# Patient Record
Sex: Male | Born: 1975 | Race: White | Hispanic: No | Marital: Married | Smoking: Never smoker
Health system: Southern US, Community
[De-identification: ages and names within clinical notes are randomized; demographics above are authoritative.]

## PROBLEM LIST (undated history)

## (undated) DIAGNOSIS — J309 Allergic rhinitis, unspecified: Secondary | ICD-10-CM

## (undated) DIAGNOSIS — R7303 Prediabetes: Secondary | ICD-10-CM

## (undated) DIAGNOSIS — C801 Malignant (primary) neoplasm, unspecified: Secondary | ICD-10-CM

## (undated) HISTORY — DX: Malignant (primary) neoplasm, unspecified: C80.1

## (undated) HISTORY — PX: TONSILLECTOMY: SUR1361

## (undated) HISTORY — DX: Allergic rhinitis, unspecified: J30.9

## (undated) HISTORY — PX: HERNIA REPAIR: SHX51

## (undated) HISTORY — DX: Prediabetes: R73.03

---

## 2006-01-05 ENCOUNTER — Emergency Department (HOSPITAL_COMMUNITY): Admission: EM | Admit: 2006-01-05 | Discharge: 2006-01-05 | Payer: Self-pay | Admitting: Family Medicine

## 2006-05-14 ENCOUNTER — Emergency Department (HOSPITAL_COMMUNITY): Admission: EM | Admit: 2006-05-14 | Discharge: 2006-05-14 | Payer: Self-pay | Admitting: Family Medicine

## 2007-02-02 ENCOUNTER — Emergency Department (HOSPITAL_COMMUNITY): Admission: EM | Admit: 2007-02-02 | Discharge: 2007-02-02 | Payer: Self-pay | Admitting: Emergency Medicine

## 2010-12-11 ENCOUNTER — Encounter: Payer: Self-pay | Admitting: Internal Medicine

## 2010-12-11 DIAGNOSIS — Z0289 Encounter for other administrative examinations: Secondary | ICD-10-CM

## 2013-06-02 ENCOUNTER — Encounter (HOSPITAL_BASED_OUTPATIENT_CLINIC_OR_DEPARTMENT_OTHER): Payer: Self-pay | Admitting: Emergency Medicine

## 2013-06-02 ENCOUNTER — Emergency Department (HOSPITAL_BASED_OUTPATIENT_CLINIC_OR_DEPARTMENT_OTHER)
Admission: EM | Admit: 2013-06-02 | Discharge: 2013-06-02 | Disposition: A | Discharge status: Home / Self Care | Payer: 59 | Attending: Emergency Medicine | Admitting: Emergency Medicine

## 2013-06-02 DIAGNOSIS — Z23 Encounter for immunization: Secondary | ICD-10-CM | POA: Insufficient documentation

## 2013-06-02 DIAGNOSIS — W268XXA Contact with other sharp object(s), not elsewhere classified, initial encounter: Secondary | ICD-10-CM | POA: Insufficient documentation

## 2013-06-02 DIAGNOSIS — Y929 Unspecified place or not applicable: Secondary | ICD-10-CM | POA: Insufficient documentation

## 2013-06-02 DIAGNOSIS — S61412A Laceration without foreign body of left hand, initial encounter: Secondary | ICD-10-CM

## 2013-06-02 DIAGNOSIS — Y9389 Activity, other specified: Secondary | ICD-10-CM | POA: Insufficient documentation

## 2013-06-02 DIAGNOSIS — S61409A Unspecified open wound of unspecified hand, initial encounter: Secondary | ICD-10-CM | POA: Insufficient documentation

## 2013-06-02 MED ORDER — TETANUS-DIPHTH-ACELL PERTUSSIS 5-2.5-18.5 LF-MCG/0.5 IM SUSP
0.5000 mL | Freq: Once | INTRAMUSCULAR | Status: AC
  Administered 2013-06-02: 0.5 mL via INTRAMUSCULAR
  Filled 2013-06-02: qty 0.5

## 2013-06-02 MED ORDER — BACITRACIN 500 UNIT/GM EX OINT
1.0000 | TOPICAL_OINTMENT | Freq: Two times a day (BID) | CUTANEOUS | Status: DC
Start: 2013-06-02 — End: 2013-06-03
  Administered 2013-06-02: 1 via TOPICAL
  Filled 2013-06-02: qty 0.9

## 2013-06-02 NOTE — ED Provider Notes (Signed)
CSN: [id number]     Arrival date & time 06/02/13  1807 History   First MD Initiated Contact with Patient 06/02/13 2051     Chief Complaint  Patient presents with  . Hand Injury     (Consider location/radiation/quality/duration/timing/severity/associated sxs/prior Treatment) Patient is a 38 y.o. male presenting with scalp laceration. The history is provided by the patient.  Head Laceration This is a new problem. The current episode started today. The problem has been unchanged.   Jimmy Hodge is a 38 y.o. male who presents to the ED with a laceration to the left hand. He picked up a vase that was broken and it cut him. He applied pressure at home but the bleeding continued. He denies any other problems or injuries tonight.   History reviewed. No pertinent past medical history. Past Surgical History  Procedure Laterality Date  . Tonsillectomy     No family history on file. History  Substance Use Topics  . Smoking status: Never Smoker   . Smokeless tobacco: Not on file  . Alcohol Use: Yes    Review of Systems Negative except as stated in HPI   Allergies  Review of patient's allergies indicates no known allergies.  Home Medications  No current outpatient prescriptions on file. BP 126/83  Pulse 84  Temp(Src) 98.3 F (36.8 C) (Oral)  Resp 16  SpO2 100% Physical Exam  Nursing note and vitals reviewed. Constitutional: He is oriented to person, place, and time. He appears well-developed and well-nourished. No distress.  HENT:  Head: Normocephalic.  Eyes: EOM are normal.  Neck: Neck supple.  Cardiovascular: Normal rate.   Pulmonary/Chest: Effort normal.  Musculoskeletal:       Left hand: He exhibits tenderness and laceration. He exhibits normal range of motion, normal capillary refill, no deformity and no swelling. Normal sensation noted. Normal strength noted.       Hands: V shaped flap laceration 2 cm  Neurological: He is alert and oriented to person, place, and  time. No cranial nerve deficit.  Skin: Skin is warm and dry.  Psychiatric: He has a normal mood and affect. His behavior is normal.    ED Course  Procedures  LACERATION REPAIR Performed by: Starkisha Tullis Authorized by: Geneviene Tesch Consent: Verbal consent obtained. Risks and benefits: risks, benefits and alternatives were discussed Consent given by: patient Patient identity confirmed: provided demographic data Prepped and Draped in normal sterile fashion Wound explored  Laceration Location: left hand V shaped flap  Laceration Length: 2 cm  No Foreign Bodies seen or palpated  Anesthesia: local infiltration  Local anesthetic: lidocaine 2% without epinephrine  Anesthetic total: 2 ml  Irrigation method: syringe Amount of cleaning: standard  Skin closure: 5-0 prolene  Number of sutures: 3  Technique: interrupted  Patient tolerance: Patient tolerated the procedure well with no immediate complications.  MDM  38 y.o. male with laceration to the left hand. Stable for discharge without neurovascular deficits. Tetanus updated. Bacitracin ointment and dressing applied. He will follow up in 7 days for suture removal. He will return sooner for any problems.     [place], [place] 06/02/13 2213

## 2013-06-02 NOTE — Discharge Instructions (Signed)
Thank you for allowing me to take care of you today. I did not see any glass on exam of your laceration. If there are any small pieces of glass they will surface after a period of time and should not cause a problem.  Follow up with your doctor, Urgent Care or here for suture removal in 7 days. Return sooner for any signs of infection.  Be careful with glass and have a great week.

## 2013-06-02 NOTE — ED Provider Notes (Signed)
Medical screening examination/treatment/procedure(s) were performed by non-physician practitioner and as supervising physician I was immediately available for consultation/collaboration.   EKG Interpretation None       Threasa Beards, MD 06/02/13 2318

## 2013-06-02 NOTE — ED Notes (Signed)
Bacitracin applied to wound, covered with 4 x 4 and wrapped with kerlix

## 2013-06-02 NOTE — ED Notes (Signed)
Laceration to his left hand on a broken vase. Bleeding controlled.

## 2013-06-02 NOTE — ED Notes (Signed)
Pt prefers not to have an xray for FB

## 2013-08-02 DIAGNOSIS — J309 Allergic rhinitis, unspecified: Secondary | ICD-10-CM | POA: Insufficient documentation

## 2016-12-10 DIAGNOSIS — R7303 Prediabetes: Secondary | ICD-10-CM | POA: Insufficient documentation

## 2016-12-10 DIAGNOSIS — R739 Hyperglycemia, unspecified: Secondary | ICD-10-CM | POA: Insufficient documentation

## 2018-05-01 DIAGNOSIS — B9789 Other viral agents as the cause of diseases classified elsewhere: Secondary | ICD-10-CM | POA: Diagnosis not present

## 2018-05-01 DIAGNOSIS — J069 Acute upper respiratory infection, unspecified: Secondary | ICD-10-CM | POA: Diagnosis not present

## 2018-10-15 ENCOUNTER — Telehealth: Payer: Self-pay

## 2018-10-15 NOTE — Telephone Encounter (Signed)
Schedule a new patient appointment with his convenience

## 2018-10-15 NOTE — Telephone Encounter (Signed)
Okay to schedule NP appt at his convenience.  

## 2018-10-15 NOTE — Telephone Encounter (Signed)
Copied from [place] [id number]. Topic: Appointment Scheduling - Scheduling Inquiry for Clinic >> Oct 15, 2018  4:14 PM Celene Kras A wrote: Reason for CRM: Pt called stating Dr. Larose Kells told his wife he would approve him to start care. Please advise.

## 2018-10-19 NOTE — Telephone Encounter (Signed)
Called patient no answer left msg on VM

## 2018-10-22 ENCOUNTER — Other Ambulatory Visit: Payer: Self-pay

## 2018-10-25 NOTE — Telephone Encounter (Signed)
NP appt scheduled

## 2018-10-27 DIAGNOSIS — D225 Melanocytic nevi of trunk: Secondary | ICD-10-CM | POA: Diagnosis not present

## 2018-10-27 DIAGNOSIS — L301 Dyshidrosis [pompholyx]: Secondary | ICD-10-CM | POA: Diagnosis not present

## 2018-10-27 DIAGNOSIS — D1801 Hemangioma of skin and subcutaneous tissue: Secondary | ICD-10-CM | POA: Diagnosis not present

## 2018-10-27 DIAGNOSIS — L821 Other seborrheic keratosis: Secondary | ICD-10-CM | POA: Diagnosis not present

## 2018-11-08 ENCOUNTER — Other Ambulatory Visit: Payer: Self-pay

## 2018-11-09 ENCOUNTER — Encounter: Payer: Self-pay | Admitting: Internal Medicine

## 2018-11-09 ENCOUNTER — Ambulatory Visit (INDEPENDENT_AMBULATORY_CARE_PROVIDER_SITE_OTHER): Payer: BC Managed Care – PPO | Admitting: Internal Medicine

## 2018-11-09 VITALS — BP 122/86 | HR 83 | Temp 97.3°F | Resp 16 | Ht 70.0 in | Wt 190.5 lb

## 2018-11-09 DIAGNOSIS — R7303 Prediabetes: Secondary | ICD-10-CM | POA: Diagnosis not present

## 2018-11-09 DIAGNOSIS — Z0000 Encounter for general adult medical examination without abnormal findings: Secondary | ICD-10-CM

## 2018-11-09 LAB — LIPID PANEL
Cholesterol: 217 mg/dL — ABNORMAL HIGH (ref 0–200)
HDL: 57.4 mg/dL (ref >39.00)
LDL Cholesterol: 141 mg/dL — ABNORMAL HIGH (ref 0–99)
NonHDL: 159.96
Total CHOL/HDL Ratio: 4
Triglycerides: 94 mg/dL (ref 0.0–149.0)
VLDL: 18.8 mg/dL (ref 0.0–40.0)

## 2018-11-09 LAB — COMPREHENSIVE METABOLIC PANEL
ALT: 23 U/L (ref 0–53)
AST: 20 U/L (ref 0–37)
Albumin: 4.9 g/dL (ref 3.5–5.2)
Alkaline Phosphatase: 55 U/L (ref 39–117)
BUN: 16 mg/dL (ref 6–23)
CO2: 25 mEq/L (ref 19–32)
Calcium: 9.7 mg/dL (ref 8.4–10.5)
Chloride: 104 mEq/L (ref 96–112)
Creatinine, Ser: 0.97 mg/dL (ref 0.40–1.50)
GFR: 83.47 mL/min (ref >60.00)
Glucose, Bld: 94 mg/dL (ref 70–99)
Potassium: 4.3 mEq/L (ref 3.5–5.1)
Sodium: 138 mEq/L (ref 135–145)
Total Bilirubin: 1.3 mg/dL — ABNORMAL HIGH (ref 0.2–1.2)
Total Protein: 6.6 g/dL (ref 6.0–8.3)

## 2018-11-09 LAB — CBC WITH DIFFERENTIAL/PLATELET
Basophils Absolute: 0.1 10*3/uL (ref 0.0–0.1)
Basophils Relative: 0.8 % (ref 0.0–3.0)
Eosinophils Absolute: 0.1 10*3/uL (ref 0.0–0.7)
Eosinophils Relative: 1.2 % (ref 0.0–5.0)
HCT: 45.8 % (ref 39.0–52.0)
Hemoglobin: 15.8 g/dL (ref 13.0–17.0)
Lymphocytes Relative: 28.1 % (ref 12.0–46.0)
Lymphs Abs: 1.9 10*3/uL (ref 0.7–4.0)
MCHC: 34.6 g/dL (ref 30.0–36.0)
MCV: 90.7 fl (ref 78.0–100.0)
Monocytes Absolute: 0.5 10*3/uL (ref 0.1–1.0)
Monocytes Relative: 6.7 % (ref 3.0–12.0)
Neutro Abs: 4.3 10*3/uL (ref 1.4–7.7)
Neutrophils Relative %: 63.2 % (ref 43.0–77.0)
Platelets: 288 10*3/uL (ref 150.0–400.0)
RBC: 5.05 Mil/uL (ref 4.22–5.81)
RDW: 13.3 % (ref 11.5–15.5)
WBC: 6.8 10*3/uL (ref 4.0–10.5)

## 2018-11-09 LAB — TSH: TSH: 0.85 u[IU]/mL (ref 0.35–4.50)

## 2018-11-09 LAB — HEMOGLOBIN A1C: Hgb A1c MFr Bld: 5.3 % (ref 4.6–6.5)

## 2018-11-09 NOTE — Patient Instructions (Addendum)
GO TO THE LAB : Get the blood work     GO TO THE FRONT DESK Schedule your next appointment for your next physical exam in 1 year

## 2018-11-09 NOTE — Assessment & Plan Note (Signed)
-  Td: 2015 -Flu shot this year recommended -Diet is regular to healthy.  He exercise 3-4 times a week. -Labs: CMP, FLP, CBC, A1c, TSH -EtOH: Discussed the issue, 2 drinks daily are considered okay.

## 2018-11-09 NOTE — Progress Notes (Signed)
Subjective:    Patient ID: Jimmy Hodge, male    DOB: 1976-01-20, 43 y.o.   MRN: [id number]  DOS:  11/09/2018 Type of visit - description: New patient, CPX No major concerns   Review of Systems  A 14 point review of systems is negative   Past Medical History:  Diagnosis Date  . Allergic rhinitis   . Prediabetes     Past Surgical History:  Procedure Laterality Date  . HERNIA REPAIR  ? 2015   bilateral ?  . TONSILLECTOMY      Social History   Socioeconomic History  . Marital status: Married    Spouse name: Not on file  . Number of children: 2  . Years of education: Not on file  . Highest education level: Not on file  Occupational History  . 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 Financial resource strain: Not on file  . Food insecurity    Worry: Not on file    Inability: Not on file  . Transportation needs    Medical: Not on file    Non-medical: Not on file  Tobacco Use  . Smoking status: Never Smoker  . Smokeless tobacco: Never Used  Substance and Sexual Activity  . Alcohol use: Yes  . Drug use: No    Comment: qd   . Sexual activity: Not on file  Lifestyle  . Physical activity    Days per week: Not on file    Minutes per session: Not on file  . Stress: Not on file  Relationships  . Social Herbalist on phone: Not on file    Gets together: Not on file    Attends religious service: Not on file    Active member of club or organization: Not on file    Attends meetings of clubs or organizations: Not on file    Relationship status: Not on file  . Intimate partner violence    Fear of current or ex partner: Not on file    Emotionally abused: Not on file    Physically abused: Not on file    Forced sexual activity: Not on file  Other Topics Concern  . Not on file  Social History Narrative   Boys 2008, 2007     Family History  Problem Relation Age of Onset  . Hypertension Father   . Leukemia Father        CML?  . Colon  cancer Neg Hx   . Prostate cancer Neg Hx   . CAD Neg Hx      Allergies as of 11/09/2018   No Known Allergies     Medication List    as of November 09, 2018 11:59 PM   You have not been prescribed any medications.         Objective:   Physical Exam BP 122/86 (BP Location: Left Arm, Patient Position: Sitting, Cuff Size: Small)   Pulse 83   Temp (!) 97.3 F (36.3 C) (Temporal)   Resp 16   Ht 5\' 10"  (1.778 m)   Wt 190 lb 8 oz (86.4 kg)   SpO2 98%   BMI 27.33 kg/m  General: Well developed, NAD, BMI noted Neck: No  thyromegaly  HEENT:  Normocephalic . Face symmetric, atraumatic Lungs:  CTA B Normal respiratory effort, no intercostal retractions, no accessory muscle use. Heart: RRR,  no murmur.  No pretibial edema bilaterally  Abdomen:  Not distended, soft, non-tender. No rebound  or rigidity.   Skin: Exposed areas without rash. Not pale. Not jaundice Neurologic:  alert & oriented X3.  Speech normal, gait appropriate for age and unassisted Strength symmetric and appropriate for age.  Psych: Cognition and judgment appear intact.  Cooperative with normal attention span and concentration.  Behavior appropriate. No anxious or depressed appearing.     Assessment    Assessment (new patient, referred by his wife) Healthy  Plan Here for CPX Labs from 2018: A1c 4.8, TSH, CBC normal, total cholesterol 191, LDL 119 Hyperglycemia?  Checking labs Get an EKG Applying for a concealed carry, will sign paperwork.  Will sign it. RTC 1 year

## 2018-11-09 NOTE — Progress Notes (Signed)
Pre visit review using our clinic review tool, if applicable. No additional management support is needed unless otherwise documented below in the visit note. 

## 2018-11-10 DIAGNOSIS — Z09 Encounter for follow-up examination after completed treatment for conditions other than malignant neoplasm: Secondary | ICD-10-CM | POA: Insufficient documentation

## 2018-11-10 NOTE — Assessment & Plan Note (Signed)
Here for CPX Labs from 2018: A1c 4.8, TSH, CBC normal, total cholesterol 191, LDL 119 Hyperglycemia?  Checking labs Get an EKG Applying for a concealed carry, will sign paperwork.  Will sign it. RTC 1 year

## 2019-06-27 DIAGNOSIS — L308 Other specified dermatitis: Secondary | ICD-10-CM | POA: Diagnosis not present

## 2019-06-27 DIAGNOSIS — D224 Melanocytic nevi of scalp and neck: Secondary | ICD-10-CM | POA: Diagnosis not present

## 2019-06-27 DIAGNOSIS — D234 Other benign neoplasm of skin of scalp and neck: Secondary | ICD-10-CM | POA: Diagnosis not present

## 2019-11-10 ENCOUNTER — Encounter: Payer: Self-pay | Admitting: Internal Medicine

## 2019-11-10 ENCOUNTER — Ambulatory Visit (INDEPENDENT_AMBULATORY_CARE_PROVIDER_SITE_OTHER): Payer: BC Managed Care – PPO | Admitting: Internal Medicine

## 2019-11-10 ENCOUNTER — Other Ambulatory Visit: Payer: Self-pay

## 2019-11-10 VITALS — BP 120/82 | HR 62 | Temp 98.1°F | Resp 16 | Ht 70.0 in | Wt 195.4 lb

## 2019-11-10 DIAGNOSIS — Z0000 Encounter for general adult medical examination without abnormal findings: Secondary | ICD-10-CM | POA: Diagnosis not present

## 2019-11-10 DIAGNOSIS — Z1159 Encounter for screening for other viral diseases: Secondary | ICD-10-CM

## 2019-11-10 DIAGNOSIS — Z114 Encounter for screening for human immunodeficiency virus [HIV]: Secondary | ICD-10-CM

## 2019-11-10 DIAGNOSIS — Z09 Encounter for follow-up examination after completed treatment for conditions other than malignant neoplasm: Secondary | ICD-10-CM

## 2019-11-10 LAB — LIPID PANEL
Cholesterol: 182 mg/dL (ref 0–200)
HDL: 53.3 mg/dL (ref >39.00)
LDL Cholesterol: 115 mg/dL — ABNORMAL HIGH (ref 0–99)
NonHDL: 129.02
Total CHOL/HDL Ratio: 3
Triglycerides: 69 mg/dL (ref 0.0–149.0)
VLDL: 13.8 mg/dL (ref 0.0–40.0)

## 2019-11-10 LAB — COMPREHENSIVE METABOLIC PANEL
ALT: 38 U/L (ref 0–53)
AST: 20 U/L (ref 0–37)
Albumin: 4.7 g/dL (ref 3.5–5.2)
Alkaline Phosphatase: 69 U/L (ref 39–117)
BUN: 16 mg/dL (ref 6–23)
CO2: 27 mEq/L (ref 19–32)
Calcium: 9.6 mg/dL (ref 8.4–10.5)
Chloride: 103 mEq/L (ref 96–112)
Creatinine, Ser: 0.96 mg/dL (ref 0.40–1.50)
GFR: 84.11 mL/min (ref >60.00)
Glucose, Bld: 100 mg/dL — ABNORMAL HIGH (ref 70–99)
Potassium: 4.1 mEq/L (ref 3.5–5.1)
Sodium: 139 mEq/L (ref 135–145)
Total Bilirubin: 1.6 mg/dL — ABNORMAL HIGH (ref 0.2–1.2)
Total Protein: 6.6 g/dL (ref 6.0–8.3)

## 2019-11-10 NOTE — Progress Notes (Signed)
   Subjective:    Patient ID: Jimmy Hodge, male    DOB: 1976-03-24, 44 y.o.   MRN: [id number]  DOS:  11/10/2019 Type of visit - description: CPX Since the last office visit he is doing well. Has no symptoms.   Review of Systems   A 14 point review of systems is negative    Past Medical History:  Diagnosis Date  . Allergic rhinitis   . Prediabetes     Past Surgical History:  Procedure Laterality Date  . HERNIA REPAIR  ? 2015   bilateral ?  . TONSILLECTOMY      Allergies as of 11/10/2019   No Known Allergies     Medication List    as of November 10, 2019 11:59 PM   You have not been prescribed any medications.        Objective:   Physical Exam BP 120/82 (BP Location: Left Arm, Patient Position: Sitting, Cuff Size: Normal)   Pulse 62   Temp 98.1 F (36.7 C) (Oral)   Resp 16   Ht 5\' 10"  (1.778 m)   Wt 195 lb 6 oz (88.6 kg)   SpO2 94%   BMI 28.03 kg/m  General: Well developed, NAD, BMI noted Neck: No  thyromegaly  HEENT:  Normocephalic . Face symmetric, atraumatic Lungs:  CTA B Normal respiratory effort, no intercostal retractions, no accessory muscle use. Heart: RRR,  no murmur.  Abdomen:  Not distended, soft, non-tender. No rebound or rigidity.   Lower extremities: no pretibial edema bilaterally  Skin: Exposed areas without rash. Not pale. Not jaundice Neurologic:  alert & oriented X3.  Speech normal, gait appropriate for age and unassisted Strength symmetric and appropriate for age.  Psych: Cognition and judgment appear intact.  Cooperative with normal attention span and concentration.  Behavior appropriate. No anxious or depressed appearing.     Assessment     Assessment (new patient, referred by his wife) Healthy  Plan Here for CPX. Doing well. RTC 1 year.    This visit occurred during the SARS-CoV-2 public health emergency.  Safety protocols were in place, including screening questions prior to the visit, additional usage of  staff PPE, and extensive cleaning of exam room while observing appropriate contact time as indicated for disinfecting solutions.

## 2019-11-10 NOTE — Progress Notes (Signed)
Pre visit review using our clinic review tool, if applicable. No additional management support is needed unless otherwise documented below in the visit note. 

## 2019-11-10 NOTE — Patient Instructions (Signed)
   GO TO THE LAB : Get the blood work     GO TO THE FRONT DESK, PLEASE SCHEDULE YOUR APPOINTMENTS Come back for a physical exam in 1 year 

## 2019-11-11 ENCOUNTER — Encounter: Payer: Self-pay | Admitting: Internal Medicine

## 2019-11-11 LAB — HEPATITIS C ANTIBODY
Hepatitis C Ab: NONREACTIVE
SIGNAL TO CUT-OFF: 0.01 (ref <1.00)

## 2019-11-11 LAB — HIV ANTIBODY (ROUTINE TESTING W REFLEX): HIV 1&2 Ab, 4th Generation: NONREACTIVE

## 2019-11-11 NOTE — Assessment & Plan Note (Signed)
Here for CPX Doing well. RTC 1 year 

## 2019-11-11 NOTE — Assessment & Plan Note (Signed)
-  Td: 2015 - covid vaccine: Hesitant to proceed, d/w pt: pro>>>cons ! -Flu shot this year recommended -Diet is regular to healthy. Exercises a bit less than before -CCS: 3 options discussed, at this point I favor a IFOB  -Labs: CMP, FLP, hep C, HIV -EtOH: Reminded that 2 drinks daily is considered okay.

## 2020-03-19 DIAGNOSIS — J029 Acute pharyngitis, unspecified: Secondary | ICD-10-CM | POA: Diagnosis not present

## 2020-03-19 DIAGNOSIS — R059 Cough, unspecified: Secondary | ICD-10-CM | POA: Diagnosis not present

## 2020-03-29 DIAGNOSIS — Z20822 Contact with and (suspected) exposure to covid-19: Secondary | ICD-10-CM | POA: Diagnosis not present

## 2020-03-29 DIAGNOSIS — J069 Acute upper respiratory infection, unspecified: Secondary | ICD-10-CM | POA: Diagnosis not present

## 2020-03-29 DIAGNOSIS — J019 Acute sinusitis, unspecified: Secondary | ICD-10-CM | POA: Diagnosis not present

## 2020-03-29 DIAGNOSIS — J189 Pneumonia, unspecified organism: Secondary | ICD-10-CM | POA: Diagnosis not present

## 2020-06-29 ENCOUNTER — Encounter: Payer: Self-pay | Admitting: Internal Medicine

## 2020-08-07 ENCOUNTER — Other Ambulatory Visit (INDEPENDENT_AMBULATORY_CARE_PROVIDER_SITE_OTHER): Payer: BC Managed Care – PPO

## 2020-08-07 DIAGNOSIS — Z0000 Encounter for general adult medical examination without abnormal findings: Secondary | ICD-10-CM

## 2020-08-07 LAB — FECAL OCCULT BLOOD, IMMUNOCHEMICAL: Fecal Occult Bld: NEGATIVE

## 2020-08-27 ENCOUNTER — Ambulatory Visit (INDEPENDENT_AMBULATORY_CARE_PROVIDER_SITE_OTHER): Payer: 59 | Admitting: Internal Medicine

## 2020-08-27 ENCOUNTER — Encounter: Payer: Self-pay | Admitting: Internal Medicine

## 2020-08-27 ENCOUNTER — Other Ambulatory Visit: Payer: Self-pay

## 2020-08-27 VITALS — BP 120/84 | HR 58 | Temp 98.3°F | Resp 16 | Ht 70.0 in | Wt 181.1 lb

## 2020-08-27 DIAGNOSIS — Z0000 Encounter for general adult medical examination without abnormal findings: Secondary | ICD-10-CM

## 2020-08-27 DIAGNOSIS — Z1211 Encounter for screening for malignant neoplasm of colon: Secondary | ICD-10-CM

## 2020-08-27 MED ORDER — MUPIROCIN 2 % EX OINT: 1.0000 "application " | TOPICAL_OINTMENT | Freq: Two times a day (BID) | CUTANEOUS | 0 refills | Status: DC

## 2020-08-27 NOTE — Patient Instructions (Signed)
Apply the ointment to the bump of your head twice a day for 10 days. If there is no back to normal in 2 weeks please let me know  Please call the gastroenterology office to schedule a colonoscopy: [phone]   [facility] : Get the blood work     [place], [place] back for a physical exam in 1 year

## 2020-08-27 NOTE — Progress Notes (Signed)
Subjective:    Patient ID: Jimmy Hodge, male    DOB: 05-27-1975, 45 y.o.   MRN: [id number]  DOS:  08/27/2020 Type of visit - description: CPX  Since the last office visit is doing well. Did notice a bump on the scalp few days ago. Slightly painful but no discharge.   Review of Systems  Other than above, a 14 point review of systems is negative      Past Medical History:  Diagnosis Date  . Allergic rhinitis   . Prediabetes     Past Surgical History:  Procedure Laterality Date  . HERNIA REPAIR  ? 2015   bilateral ?  . TONSILLECTOMY     Social History   Socioeconomic History  . Marital status: Married    Spouse name: Not on file  . Number of children: 2  . Years of education: Not on file  . Highest education level: Not on file  Occupational History  . Occupation: [employer]  Tobacco Use  . Smoking status: Never Smoker  . Smokeless tobacco: Never Used  Substance and Sexual Activity  . Alcohol use: Yes    Comment: social  . Drug use: No    Comment: qd   . Sexual activity: Not on file  Other Topics Concern  . Not on file  Social History Narrative   Boys 2008, 2007   Social Determinants of Health   Financial Resource Strain: Not on file  Food Insecurity: Not on file  Transportation Needs: Not on file  Physical Activity: Not on file  Stress: Not on file  Social Connections: Not on file  Intimate Partner Violence: Not on file    Allergies as of 08/27/2020   No Known Allergies     Medication List       Accurate as of August 27, 2020 11:59 PM. If you have any questions, ask your nurse or doctor.        mupirocin ointment 2 % Commonly known as: BACTROBAN Apply 1 application topically 2 (two) times daily. Started by: Kathlene November, MD          Objective:   Physical Exam HENT:     Head:     BP 120/84 (BP Location: Left Arm, Patient Position: Sitting, Cuff Size: Small)   Pulse (!) 58   Temp 98.3 F (36.8 C) (Oral)   Resp 16   Ht 5\' 10"   (1.778 m)   Wt 181 lb 2 oz (82.2 kg)   SpO2 97%   BMI 25.99 kg/m  General: Well developed, NAD, BMI noted Neck: No  thyromegaly  HEENT:  Normocephalic . Face symmetric, atraumatic Lungs:  CTA B Normal respiratory effort, no intercostal retractions, no accessory muscle use. Heart: RRR,  no murmur.  Abdomen:  Not distended, soft, non-tender. No rebound or rigidity.   Lower extremities: no pretibial edema bilaterally  Skin: Exposed areas without rash. Not pale. Not jaundice Neurologic:  alert & oriented X3.  Speech normal, gait appropriate for age and unassisted Strength symmetric and appropriate for age.  Psych: Cognition and judgment appear intact.  Cooperative with normal attention span and concentration.  Behavior appropriate. No anxious or depressed appearing.     Assessment      ASSESSMENT  (new patient 2020, referred by his wife) Healthy  Plan Here for CPX. Doing well. Skin lesion, scalp: Possibly a superficial infection, will treat with Bactroban, if no better he will let me know. RTC 1 year.   This visit  occurred during the SARS-CoV-2 public health emergency.  Safety protocols were in place, including screening questions prior to the visit, additional usage of staff PPE, and extensive cleaning of exam room while observing appropriate contact time as indicated for disinfecting solutions.

## 2020-08-27 NOTE — Assessment & Plan Note (Addendum)
-  Td: 2015 - covid vaccine: hesitant: pro>>>cons d/w pt  -CCS: Had a negative iFOB last year, 3 options discussed, elected a colonoscopy, referred. - Lately doing very well with diet and exercise, has lost weight. Praised. -Labs: CMP, CBC, FLP, TSH

## 2020-08-28 ENCOUNTER — Encounter: Payer: Self-pay | Admitting: Internal Medicine

## 2020-08-28 LAB — CBC WITH DIFFERENTIAL/PLATELET
Basophils Absolute: 0 10*3/uL (ref 0.0–0.1)
Basophils Relative: 0.9 % (ref 0.0–3.0)
Eosinophils Absolute: 0.1 10*3/uL (ref 0.0–0.7)
Eosinophils Relative: 1.8 % (ref 0.0–5.0)
HCT: 44.1 % (ref 39.0–52.0)
Hemoglobin: 15.5 g/dL (ref 13.0–17.0)
Lymphocytes Relative: 32.6 % (ref 12.0–46.0)
Lymphs Abs: 1.8 10*3/uL (ref 0.7–4.0)
MCHC: 35.1 g/dL (ref 30.0–36.0)
MCV: 88.4 fl (ref 78.0–100.0)
Monocytes Absolute: 0.4 10*3/uL (ref 0.1–1.0)
Monocytes Relative: 7.6 % (ref 3.0–12.0)
Neutro Abs: 3.1 10*3/uL (ref 1.4–7.7)
Neutrophils Relative %: 57.1 % (ref 43.0–77.0)
Platelets: 295 10*3/uL (ref 150.0–400.0)
RBC: 4.98 Mil/uL (ref 4.22–5.81)
RDW: 13.1 % (ref 11.5–15.5)
WBC: 5.5 10*3/uL (ref 4.0–10.5)

## 2020-08-28 LAB — COMPREHENSIVE METABOLIC PANEL
ALT: 29 U/L (ref 0–53)
AST: 21 U/L (ref 0–37)
Albumin: 4.6 g/dL (ref 3.5–5.2)
Alkaline Phosphatase: 57 U/L (ref 39–117)
BUN: 7 mg/dL (ref 6–23)
CO2: 25 mEq/L (ref 19–32)
Calcium: 9.6 mg/dL (ref 8.4–10.5)
Chloride: 105 mEq/L (ref 96–112)
Creatinine, Ser: 0.83 mg/dL (ref 0.40–1.50)
GFR: 104.31 mL/min (ref >60.00)
Glucose, Bld: 98 mg/dL (ref 70–99)
Potassium: 4.2 mEq/L (ref 3.5–5.1)
Sodium: 142 mEq/L (ref 135–145)
Total Bilirubin: 1.1 mg/dL (ref 0.2–1.2)
Total Protein: 6.3 g/dL (ref 6.0–8.3)

## 2020-08-28 LAB — LIPID PANEL
Cholesterol: 184 mg/dL (ref 0–200)
HDL: 57 mg/dL (ref >39.00)
LDL Cholesterol: 113 mg/dL — ABNORMAL HIGH (ref 0–99)
NonHDL: 127
Total CHOL/HDL Ratio: 3
Triglycerides: 69 mg/dL (ref 0.0–149.0)
VLDL: 13.8 mg/dL (ref 0.0–40.0)

## 2020-08-28 LAB — TSH: TSH: 1.01 u[IU]/mL (ref 0.35–4.50)

## 2020-08-28 NOTE — Assessment & Plan Note (Signed)
Here for CPX. Doing well. Skin lesion, scalp: Possibly a superficial infection, will treat with Bactroban, if no better he will let me know. RTC 1 year.

## 2020-11-14 ENCOUNTER — Encounter: Payer: BC Managed Care – PPO | Admitting: Internal Medicine

## 2020-11-19 ENCOUNTER — Other Ambulatory Visit: Payer: Self-pay

## 2020-11-19 DIAGNOSIS — Z1211 Encounter for screening for malignant neoplasm of colon: Secondary | ICD-10-CM

## 2020-12-11 ENCOUNTER — Encounter: Payer: Self-pay | Admitting: Gastroenterology

## 2021-02-05 ENCOUNTER — Other Ambulatory Visit: Payer: Self-pay

## 2021-02-05 ENCOUNTER — Ambulatory Visit (AMBULATORY_SURGERY_CENTER): Payer: 59

## 2021-02-05 ENCOUNTER — Encounter: Payer: Self-pay | Admitting: Gastroenterology

## 2021-02-05 VITALS — Ht 70.0 in | Wt 185.0 lb

## 2021-02-05 DIAGNOSIS — Z1211 Encounter for screening for malignant neoplasm of colon: Secondary | ICD-10-CM

## 2021-02-05 MED ORDER — PEG 3350-KCL-NA BICARB-NACL 420 G PO SOLR: 4000.0000 mL | Freq: Once | ORAL | 0 refills | Status: AC

## 2021-02-05 NOTE — Progress Notes (Signed)

## 2021-02-11 ENCOUNTER — Encounter: Payer: Self-pay | Admitting: Internal Medicine

## 2021-02-11 ENCOUNTER — Ambulatory Visit: Payer: 59 | Admitting: Internal Medicine

## 2021-02-11 ENCOUNTER — Other Ambulatory Visit: Payer: Self-pay

## 2021-02-11 ENCOUNTER — Ambulatory Visit (INDEPENDENT_AMBULATORY_CARE_PROVIDER_SITE_OTHER): Payer: 59 | Admitting: Internal Medicine

## 2021-02-11 ENCOUNTER — Ambulatory Visit (HOSPITAL_BASED_OUTPATIENT_CLINIC_OR_DEPARTMENT_OTHER)
Admission: RE | Admit: 2021-02-11 | Discharge: 2021-02-11 | Disposition: A | Discharge status: Home / Self Care | Payer: 59 | Source: Ambulatory Visit | Attending: Internal Medicine | Admitting: Internal Medicine

## 2021-02-11 VITALS — BP 142/90 | HR 89 | Temp 98.2°F | Resp 16 | Ht 70.0 in | Wt 189.5 lb

## 2021-02-11 DIAGNOSIS — N5089 Other specified disorders of the male genital organs: Secondary | ICD-10-CM | POA: Insufficient documentation

## 2021-02-11 DIAGNOSIS — N451 Epididymitis: Secondary | ICD-10-CM

## 2021-02-11 MED ORDER — LEVOFLOXACIN 500 MG PO TABS: 500.0000 mg | ORAL_TABLET | Freq: Every day | ORAL | 0 refills | Status: AC

## 2021-02-11 NOTE — Progress Notes (Signed)
   Subjective:    Patient ID: Jimmy Hodge, male    DOB: [birth year], 45 y.o.   MRN: [id number]  DOS:  02/11/2021 Type of visit - description: Acute  3 days ago, he felt a lump at the left scrotum. This is a first time he feels something unusual there. Denies any injury. Area is not tender. Denies any LUTS. No penile discharge.     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 2015   bilateral ?   TONSILLECTOMY      Allergies as of 02/11/2021   No Known Allergies      Medication List        Accurate as of February 11, 2021  8:26 PM. If you have any questions, ask your nurse or doctor.          levofloxacin 500 MG tablet Commonly known as: LEVAQUIN Take 1 tablet (500 mg total) by mouth daily for 7 days. Started by: Kathlene November, MD   mupirocin ointment 2 % Commonly known as: BACTROBAN Apply 1 application topically 2 (two) times daily.           Objective:   Physical Exam BP (!) 142/90 (BP Location: Left Arm, Patient Position: Sitting, Cuff Size: Small)   Pulse 89   Temp 98.2 F (36.8 C) (Oral)   Resp 16   Ht 5\' 10"  (1.778 m)   Wt 189 lb 8 oz (86 kg)   SpO2 98%   BMI 27.19 kg/m  General:   Well developed, NAD, BMI noted.  HEENT:  Normocephalic . Face symmetric, atraumatic Abdomen:  Not distended, soft, non-tender. No rebound or rigidity.   GU: Penis normal Scrotal contents: Testicles are symmetric. Distal from the L testicle he has a lump, about the size of a marble, not tender.  Is not hard but firm; does not seem to be attached to the testicle. Skin: Not pale. Not jaundice Lower extremities: no pretibial edema bilaterally  Neurologic:  alert &  oriented X3.  Speech normal, gait appropriate for age and unassisted Psych--  Cognition and judgment appear intact.  Cooperative with normal attention span and concentration.  Behavior appropriate. No anxious or depressed appearing.     Assessment    ASSESSMENT  (new patient 2020, referred by his wife) Healthy   PLAN Scrotal mass: Suspect epididymal cyst, recommend to get a ultrasound to document. Continue monitoring the area, call if any changes happens. Addendum: US show area of epididymitis, called pt, left a message, rx  Levaquin. Elevated BP: BP today slightly elevated, recommend to monitor, see AVS Preventive care:  Declined flu shot   This visit occurred during the SARS-CoV-2 public health emergency.  Safety protocols were in place, including screening questions prior to the visit, additional usage of staff PPE, and extensive cleaning of exam room while observing appropriate contact time as indicated for disinfecting solutions.

## 2021-02-11 NOTE — Assessment & Plan Note (Signed)
ASSESSMENT  (new patient 2020, referred by his wife) Healthy   PLAN Scrotal mass: Suspect epididymal cyst, recommend to get a ultrasound to document. Continue monitoring the area, call if any changes happens. Addendum: US show area of epididymitis, called pt, left a message, rx  Levaquin. Elevated BP: BP today slightly elevated, recommend to monitor, see AVS Preventive care:  Declined flu shot

## 2021-02-11 NOTE — Patient Instructions (Addendum)
  Check the  blood pressure   monthly   BP GOAL is between 110/65 and  135/85. If it is consistently higher or lower, let me know       STOP BY THE FIRST FLOOR:  get  a ultrasound schedule

## 2021-02-19 ENCOUNTER — Ambulatory Visit (AMBULATORY_SURGERY_CENTER): Payer: 59 | Admitting: Gastroenterology

## 2021-02-19 ENCOUNTER — Other Ambulatory Visit: Payer: Self-pay

## 2021-02-19 ENCOUNTER — Encounter: Payer: Self-pay | Admitting: Gastroenterology

## 2021-02-19 VITALS — BP 108/74 | HR 73 | Temp 98.9°F | Resp 15 | Ht 70.0 in | Wt 185.0 lb

## 2021-02-19 DIAGNOSIS — D122 Benign neoplasm of ascending colon: Secondary | ICD-10-CM

## 2021-02-19 DIAGNOSIS — Z1211 Encounter for screening for malignant neoplasm of colon: Secondary | ICD-10-CM | POA: Diagnosis not present

## 2021-02-19 DIAGNOSIS — D125 Benign neoplasm of sigmoid colon: Secondary | ICD-10-CM

## 2021-02-19 MED ORDER — SODIUM CHLORIDE 0.9 % IV SOLN: 500.0000 mL | Freq: Once | INTRAVENOUS | Status: DC

## 2021-02-19 NOTE — Patient Instructions (Signed)
Handouts were given to your care partner on polyps and diverticulosis. You may resume your current medications today. Await biopsy results.  May take 1-3 weeks to receive pathology results. Please call if any questions or concerns.     YOU HAD AN ENDOSCOPIC PROCEDURE TODAY AT [facility]:   Refer to the procedure report that was given to you for any specific questions about what was found during the examination.  If the procedure report does not answer your questions, please call your gastroenterologist to clarify.  If you requested that your care partner not be given the details of your procedure findings, then the procedure report has been included in a sealed envelope for you to review at your convenience later.  YOU SHOULD EXPECT: Some feelings of bloating in the abdomen. Passage of more gas than usual.  Walking can help get rid of the air that was put into your GI tract during the procedure and reduce the bloating. If you had a lower endoscopy (such as a colonoscopy or flexible sigmoidoscopy) you may notice spotting of blood in your stool or on the toilet paper. If you underwent a bowel prep for your procedure, you may not have a normal bowel movement for a few days.  Please Note:  You might notice some irritation and congestion in your nose or some drainage.  This is from the oxygen used during your procedure.  There is no need for concern and it should clear up in a day or so.  SYMPTOMS TO REPORT IMMEDIATELY:  Following lower endoscopy (colonoscopy or flexible sigmoidoscopy):  Excessive amounts of blood in the stool  Significant tenderness or worsening of abdominal pains  Swelling of the abdomen that is new, acute  Fever of 100F or higher  For urgent or emergent issues, a gastroenterologist can be reached at any hour by calling [phone]. Do not use MyChart messaging for urgent concerns.    DIET:  We do recommend a small meal at first, but then you may proceed  to your regular diet.  Drink plenty of fluids but you should avoid alcoholic beverages for 24 hours.  ACTIVITY:  You should plan to take it easy for the rest of today and you should NOT DRIVE or use heavy machinery until tomorrow (because of the sedation medicines used during the test).    FOLLOW UP: Our staff will call the number listed on your records 48-72 hours following your procedure to check on you and address any questions or concerns that you may have regarding the information given to you following your procedure. If we do not reach you, we will leave a message.  We will attempt to reach you two times.  During this call, we will ask if you have developed any symptoms of COVID 19. If you develop any symptoms (ie: fever, flu-like symptoms, shortness of breath, cough etc.) before then, please call [phone].  If you test positive for Covid 19 in the 2 weeks post procedure, please call and report this information to [place].    If any biopsies were taken you will be contacted by phone or by letter within the next 1-3 weeks.  Please call us at [phone] if you have not heard about the biopsies in 3 weeks.    SIGNATURES/CONFIDENTIALITY: You and/or your care partner have signed paperwork which will be entered into your electronic medical record.  These signatures attest to the fact that that the information above on your After Visit Summary has  been reviewed and is understood.  Full responsibility of the confidentiality of this discharge information lies with you and/or your care-partner.

## 2021-02-19 NOTE — Progress Notes (Signed)
HPI: This is a man at routine risk for CRC   ROS: complete GI ROS as described in HPI, all other review negative.  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 2015   bilateral ?   TONSILLECTOMY      Current Outpatient Medications  Medication Sig Dispense Refill   mupirocin ointment (BACTROBAN) 2 % Apply 1 application topically 2 (two) times daily. (Patient not taking: Reported on 02/05/2021) 22 g 0   Current Facility-Administered Medications  Medication Dose Route Frequency Provider Last Rate Last Admin   0.9 %  sodium chloride infusion  500 mL Intravenous Once Milus Banister, MD        Allergies as of 02/19/2021   (No Known Allergies)    Family History  Problem Relation Age of Onset   Hypertension Father    Leukemia Father        CML?   Colon cancer Neg Hx    Prostate cancer Neg Hx    CAD Neg Hx    Colon polyps Neg Hx    Esophageal cancer Neg Hx    Stomach cancer Neg Hx    Rectal cancer Neg Hx     Social History   Socioeconomic History   Marital status: Married    Spouse name: Not on file   Number of children: 2   Years of education: Not on file   Highest education level: Not on file  Occupational History   Occupation: [employer]  Tobacco Use   Smoking status: Never   Smokeless tobacco: Never  Vaping Use   Vaping Use: Never used  Substance and Sexual Activity   Alcohol use: Yes    Comment: social   Drug use: No    Comment: qd    Sexual activity: Not on file  Other Topics Concern   Not on file  Social History Narrative   Boys 2008, 2007   Social Determinants of Health   Financial Resource Strain: Not on file  Food Insecurity: Not on file  Transportation Needs: Not on file  Physical Activity: Not on file  Stress: Not on file   Social Connections: Not on file  Intimate Partner Violence: Not on file     Physical Exam: BP 135/67   Pulse 84   Temp 98.9 F (37.2 C) (Temporal)   Ht 5\' 10"  (1.778 m)   Wt 185 lb (83.9 kg)   SpO2 96%   BMI 26.54 kg/m  Constitutional: generally well-appearing Psychiatric: alert and oriented x3 Lungs: CTA bilaterally Heart: no MCR  Assessment and plan: 45 y.o. male with routine risk for [facility]  Colonoscoy today  Care is appropriate for the ambulatory setting.  Owens Loffler, MD [facility] 02/19/2021, 7:42 AM

## 2021-02-19 NOTE — Progress Notes (Signed)
No problems noted in the recovery room. maw 

## 2021-02-19 NOTE — Op Note (Signed)
[facility] Patient Name: Jimmy Hodge Procedure Date: 02/19/2021 8:19 AM MRN: [id number] Endoscopist: Milus Banister , MD Age: 45 Referring MD:  Date of Birth: [birth year] Gender: Male Account #: [id number] Procedure:                Colonoscopy Indications:              Screening for colorectal malignant neoplasm Medicines:                Monitored Anesthesia Care Procedure:                Pre-Anesthesia Assessment:                           - Prior to the procedure, a History and Physical                            was performed, and patient medications and                            allergies were reviewed. The patient's tolerance of                            previous anesthesia was also reviewed. The risks                            and benefits of the procedure and the sedation                            options and risks were discussed with the patient.                            All questions were answered, and informed consent                            was obtained. Prior Anticoagulants: The patient has                            taken no previous anticoagulant or antiplatelet                            agents. ASA Grade Assessment: II - A patient with                            mild systemic disease. After reviewing the risks                            and benefits, the patient was deemed in                            satisfactory condition to undergo the procedure.                           After obtaining informed consent, the colonoscope  was passed under direct vision. Throughout the                            procedure, the patient's blood pressure, pulse, and                            oxygen saturations were monitored continuously. The                            Colonoscope was introduced through the anus and                            advanced to the the cecum, identified by                            appendiceal orifice and  ileocecal valve. The                            colonoscopy was performed without difficulty. The                            patient tolerated the procedure well. The quality                            of the bowel preparation was good. The ileocecal                            valve, appendiceal orifice, and rectum were                            photographed. Scope In: 8:24:56 AM Scope Out: 8:36:36 AM Scope Withdrawal Time: 0 hours 9 minutes 29 seconds  Total Procedure Duration: 0 hours 11 minutes 40 seconds  Findings:                 Two sessile polyps were found in the sigmoid colon                            and ascending colon. The polyps were 2 to 3 mm in                            size. These polyps were removed with a cold snare.                            Resection was complete, but the polyp tissue was                            only partially retrieved.                           Multiple small and large-mouthed diverticula were                            found in the left colon.  The exam was otherwise without abnormality on                            direct and retroflexion views. Complications:            No immediate complications. Estimated blood loss:                            None. Estimated Blood Loss:     Estimated blood loss: none. Impression:               - Two 2 to 3 mm polyps in the sigmoid colon and in                            the ascending colon, removed with a cold snare.                            Complete resection. One polyp was retrieved.                           - Diverticulosis in the left colon.                           - The examination was otherwise normal on direct                            and retroflexion views. Recommendation:           - Patient has a contact number available for                            emergencies. The signs and symptoms of potential                            delayed complications were  discussed with the                            patient. Return to normal activities tomorrow.                            Written discharge instructions were provided to the                            patient.                           - Resume previous diet.                           - Continue present medications.                           - Await pathology results. Milus Banister, MD 02/19/2021 8:39:21 AM This report has been signed electronically.

## 2021-02-19 NOTE — Progress Notes (Signed)
Called to room to assist during endoscopic procedure.  Patient ID and intended procedure confirmed with present staff. Received instructions for my participation in the procedure from the performing physician.  

## 2021-02-19 NOTE — Progress Notes (Signed)
Pt's states no medical or surgical changes since previsit or office visit. 

## 2021-02-19 NOTE — Progress Notes (Signed)
A and O x3. Report to RN. Tolerated MAC anesthesia well. 

## 2021-02-21 ENCOUNTER — Telehealth: Payer: Self-pay

## 2021-02-21 NOTE — Telephone Encounter (Signed)
  Follow up Call-  Call back number 02/19/2021  Post procedure Call Back phone  # [phone]  Permission to leave phone message Yes  Some recent data might be hidden     Patient questions:  Do you have a fever, pain , or abdominal swelling? No. Pain Score  0 *  Have you tolerated food without any problems? Yes.    Have you been able to return to your normal activities? Yes.    Do you have any questions about your discharge instructions: Diet   No. Medications  No. Follow up visit  No.  Do you have questions or concerns about your Care? No.  Actions: * If pain score is 4 or above: No action needed, pain <4.

## 2021-02-22 ENCOUNTER — Encounter: Payer: Self-pay | Admitting: Gastroenterology

## 2021-05-21 ENCOUNTER — Telehealth: Payer: Self-pay | Admitting: Internal Medicine

## 2021-05-21 NOTE — Telephone Encounter (Signed)
Pt states he has an infection under the skin. He is not sure what it is, stated it does not look like a rash and did not see any bites around it, but it has gotten larger day by day. [facility] in [facility] yesterday morning, but antibiotics have not helped. He wanted to be seen tomorrow, schedule him with Lovena Le. He did state he was concerned it may be mrsa, transferred him to triage to discuss with a nurse. Please advise.

## 2021-05-21 NOTE — Telephone Encounter (Signed)
Awaiting triage note.  

## 2021-05-22 ENCOUNTER — Ambulatory Visit (INDEPENDENT_AMBULATORY_CARE_PROVIDER_SITE_OTHER): Payer: 59 | Admitting: Family Medicine

## 2021-05-22 ENCOUNTER — Encounter: Payer: Self-pay | Admitting: Family Medicine

## 2021-05-22 VITALS — BP 125/88 | HR 93 | Temp 98.2°F | Ht 70.0 in | Wt 190.0 lb

## 2021-05-22 DIAGNOSIS — L03114 Cellulitis of left upper limb: Secondary | ICD-10-CM

## 2021-05-22 MED ORDER — DOXYCYCLINE HYCLATE 100 MG PO TABS: 100.0000 mg | ORAL_TABLET | Freq: Two times a day (BID) | ORAL | 0 refills | Status: AC

## 2021-05-22 NOTE — Progress Notes (Signed)
? ?Acute Office Visit ? ?Subjective:  ? ? Patient ID: Jimmy Hodge, male    DOB: 1975-04-02, 47 y.o.   MRN: [id number] ? ?CC: cellulitis ? ? ?HPI ?Patient is in today for cellulitis ? ?Patient reports he noticed a small red, raised area about an inch diameter to left forearm on Saturday (4 days ago) which he felt was a skin infection. He did not see any abrasions, bites, etc. Redness and edema continued to spread up forearm and he went to an urgent care 2 days ago and received a prescription of clindamycin. He drew a line around the redness on the day he started ABX, and it has already spread ~1 inch outside the border since then. He denies and other rashes/lesions, fever, fatigue/malaise. Area is red, warm, edematous, 4/10 tender to touch, no drainage. 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 ? ? ?Past Medical History:  ?Diagnosis Date  ? Allergic rhinitis   ? Cancer [facility])   ? 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 Prediabetes   ? ? ?Past Surgical History:  ?Procedure Laterality Date  ? HERNIA REPAIR  ? 2015  ? bilateral ?  ? TONSILLECTOMY    ? ? ?Family History  ?Problem Relation Age of Onset  ? Hypertension Father   ? Leukemia Father   ?     CML?  ? Colon cancer Neg Hx   ? Prostate cancer Neg Hx   ? CAD Neg Hx   ? Colon polyps Neg Hx   ? Esophageal cancer Neg Hx   ? Stomach cancer Neg Hx   ? Rectal cancer Neg Hx   ? ? ?Social History  ? ?Socioeconomic History  ? Marital status: Married  ?  Spouse name: Not on file  ? Number of children: 2  ? Years of education: Not on file  ? Highest education level: Not on file  ?Occupational History  ? Occupation: [employer]  ?Tobacco Use  ? Smoking status: Never  ? Smokeless tobacco: Never  ?Vaping Use  ? Vaping Use: Never used  ?Substance and Sexual Activity  ? Alcohol use: Yes  ?  Comment: social  ? Drug use: No  ?  Comment: qd   ? Sexual activity: Not on file  ?Other Topics Concern  ? Not on file  ?Social History Narrative  ? Boys 2008, 2007  ? ?Social  Determinants of Health  ? ?Financial Resource Strain: Not on file  ?Food Insecurity: Not on file  ?Transportation Needs: Not on file  ?Physical Activity: Not on file  ?Stress: Not on file  ?Social Connections: Not on file  ?Intimate Partner Violence: Not on file  ? ? ?Outpatient Medications Prior to Visit  ?Medication Sig Dispense Refill  ? mupirocin ointment (BACTROBAN) 2 % Apply 1 application topically 2 (two) times daily. (Patient not taking: Reported on 02/05/2021) 22 g 0  ? ?No facility-administered medications prior to visit.  ? ? ?No Known Allergies ? ?Review of Systems ?All review of systems negative except what is listed in the HPI ? ?   ?Objective:  ?  ?Physical Exam ?Vitals reviewed.  ?Constitutional:   ?   Appearance: Normal appearance.  ?HENT:  ?   Head: Normocephalic and atraumatic.  ?Skin: ?   Findings: Erythema present.  ?   Comments: Approximately 1/3-1/2 of his left lateral forearm with erythema, inflammation, warmth; no drainage, induration, abrasions, fluctuance, abscess  ?Neurological:  ?   General: No focal  deficit present.  ?   Mental Status: He is alert and oriented to person, place, and time. Mental status is at baseline.  ?Psychiatric:     ?   Mood and Affect: Mood normal.     ?   Behavior: Behavior normal.     ?   Thought Content: Thought content normal.     ?   Judgment: Judgment normal.  ? ? ?BP 125/88   Pulse 93   Temp 98.2 ?F (36.8 ?C)   Ht 5\' 10"  (1.778 m)   Wt 190 lb (86.2 kg)   BMI 27.26 kg/m?  ?Wt Readings from Last 3 Encounters:  ?05/22/21 190 lb (86.2 kg)  ?02/19/21 185 lb (83.9 kg)  ?02/11/21 189 lb 8 oz (86 kg)  ? ? ?There are no preventive care reminders to display for this patient. ? ?There are no preventive care reminders to display for this patient. ? ? ?Lab Results  ?Component Value Date  ? TSH 1.01 08/27/2020  ? ?Lab Results  ?Component Value Date  ? WBC 5.5 08/27/2020  ? HGB 15.5 08/27/2020  ? HCT 44.1 08/27/2020  ? MCV 88.4 08/27/2020  ? PLT 295.0 08/27/2020   ? ?Lab Results  ?Component Value Date  ? NA 142 08/27/2020  ? K 4.2 08/27/2020  ? CO2 25 08/27/2020  ? GLUCOSE 98 08/27/2020  ? BUN 7 08/27/2020  ? CREATININE 0.83 08/27/2020  ? BILITOT 1.1 08/27/2020  ? ALKPHOS 57 08/27/2020  ? AST 21 08/27/2020  ? ALT 29 08/27/2020  ? PROT 6.3 08/27/2020  ? ALBUMIN 4.6 08/27/2020  ? CALCIUM 9.6 08/27/2020  ? GFR 104.31 08/27/2020  ? ?Lab Results  ?Component Value Date  ? CHOL 184 08/27/2020  ? ?Lab Results  ?Component Value Date  ? HDL 57.00 08/27/2020  ? ?Lab Results  ?Component Value Date  ? LDLCALC 113 (H) 08/27/2020  ? ?Lab Results  ?Component Value Date  ? TRIG 69.0 08/27/2020  ? ?Lab Results  ?Component Value Date  ? CHOLHDL 3 08/27/2020  ? ?Lab Results  ?Component Value Date  ? HGBA1C 5.3 11/09/2018  ? ? ?   ?Assessment & Plan:  ? ?1. Cellulitis of left upper extremity ?Clindamycin not helping. Stop taking and switch to doxycycline. Continue warm compresses. Elta [place] the area today so we can ensure we are making progress after a few days of doxy. Follow-up before the weekend. Patient aware of signs/symptoms requiring further/urgent evaluation.  ? ?- doxycycline (VIBRA-TABS) 100 MG tablet; Take 1 tablet (100 mg total) by mouth 2 (two) times daily for 10 days.  Dispense: 20 tablet; Refill: 0 ? ?Follow-up in 2 days, before the weekend and as needed.  ? ? ?Terrilyn Saver, NP ? ?

## 2021-05-24 ENCOUNTER — Encounter: Payer: Self-pay | Admitting: Internal Medicine

## 2021-05-24 ENCOUNTER — Encounter: Payer: Self-pay | Admitting: Family Medicine

## 2021-05-24 ENCOUNTER — Ambulatory Visit (INDEPENDENT_AMBULATORY_CARE_PROVIDER_SITE_OTHER): Payer: 59 | Admitting: Family Medicine

## 2021-05-24 VITALS — BP 114/73 | HR 80 | Ht 70.0 in | Wt 191.0 lb

## 2021-05-24 DIAGNOSIS — L03114 Cellulitis of left upper limb: Secondary | ICD-10-CM

## 2021-05-24 NOTE — Progress Notes (Signed)
? ?Acute Office Visit ? ?Subjective:  ? ? Patient ID: Jimmy Hodge, male    DOB: 1975/03/26, 46 y.o.   MRN: [id number] ? ?CC: check out  ? ? ?HPI ?Patient is in today for skin check.  ? ?He was switched to Doxycycline from Clindamycin 2 days ago (see previous note). He has been doing warm compresses/soaks. Reports the area is feeling much better and redness is now beneath the line he drew when he started the medication so we are making good progresses. Denies any fevers, chills, drainage, other rashes.  ? ? ? ? ? ?Past Medical History:  ?Diagnosis Date  ? Allergic rhinitis   ? Cancer [facility])   ? 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 Prediabetes   ? ? ?Past Surgical History:  ?Procedure Laterality Date  ? HERNIA REPAIR  ? 2015  ? bilateral ?  ? TONSILLECTOMY    ? ? ?Family History  ?Problem Relation Age of Onset  ? Hypertension Father   ? Leukemia Father   ?     CML?  ? Colon cancer Neg Hx   ? Prostate cancer Neg Hx   ? CAD Neg Hx   ? Colon polyps Neg Hx   ? Esophageal cancer Neg Hx   ? Stomach cancer Neg Hx   ? Rectal cancer Neg Hx   ? ? ?Social History  ? ?Socioeconomic History  ? Marital status: Married  ?  Spouse name: Not on file  ? Number of children: 2  ? Years of education: Not on file  ? Highest education level: Not on file  ?Occupational History  ? Occupation: [employer]  ?Tobacco Use  ? Smoking status: Never  ? Smokeless tobacco: Never  ?Vaping Use  ? Vaping Use: Never used  ?Substance and Sexual Activity  ? Alcohol use: Yes  ?  Comment: social  ? Drug use: No  ?  Comment: qd   ? Sexual activity: Not on file  ?Other Topics Concern  ? Not on file  ?Social History Narrative  ? Boys 2008, 2007  ? ?Social Determinants of Health  ? ?Financial Resource Strain: Not on file  ?Food Insecurity: Not on file  ?Transportation Needs: Not on file  ?Physical Activity: Not on file  ?Stress: Not on file  ?Social Connections: Not on file  ?Intimate Partner Violence: Not on file  ? ? ?Outpatient Medications Prior to  Visit  ?Medication Sig Dispense Refill  ? doxycycline (VIBRA-TABS) 100 MG tablet Take 1 tablet (100 mg total) by mouth 2 (two) times daily for 10 days. 20 tablet 0  ? ?No facility-administered medications prior to visit.  ? ? ?No Known Allergies ? ?Review of Systems ?All review of systems negative except what is listed in the HPI ? ?   ?Objective:  ?  ?Physical Exam ?Vitals reviewed.  ?Constitutional:   ?   General: He is not in acute distress. ?   Appearance: Normal appearance. He is not ill-appearing.  ?HENT:  ?   Head: Normocephalic and atraumatic.  ?Musculoskeletal:     ?   General: Normal range of motion.  ?Skin: ?   General: Skin is warm and dry.  ?   Capillary Refill: Capillary refill takes less than 2 seconds.  ?   Comments: Left forearm looking at least 15-20% better since last visit, continues with erythema, mild edema, warmth; no induration, fluctuance, drainage.   ?Neurological:  ?   General: No focal deficit present.  ?  Mental Status: He is alert and oriented to person, place, and time. Mental status is at baseline.  ?Psychiatric:     ?   Mood and Affect: Mood normal.     ?   Behavior: Behavior normal.     ?   Thought Content: Thought content normal.     ?   Judgment: Judgment normal.  ? ? ?BP 114/73   Pulse 80   Ht 5\' 10"  (1.778 m)   Wt 191 lb (86.6 kg)   BMI 27.41 kg/m?  ?Wt Readings from Last 3 Encounters:  ?05/24/21 191 lb (86.6 kg)  ?05/22/21 190 lb (86.2 kg)  ?02/19/21 185 lb (83.9 kg)  ? ? ?There are no preventive care reminders to display for this patient. ? ?There are no preventive care reminders to display for this patient. ? ? ?Lab Results  ?Component Value Date  ? TSH 1.01 08/27/2020  ? ?Lab Results  ?Component Value Date  ? WBC 5.5 08/27/2020  ? HGB 15.5 08/27/2020  ? HCT 44.1 08/27/2020  ? MCV 88.4 08/27/2020  ? PLT 295.0 08/27/2020  ? ?Lab Results  ?Component Value Date  ? NA 142 08/27/2020  ? K 4.2 08/27/2020  ? CO2 25 08/27/2020  ? GLUCOSE 98 08/27/2020  ? BUN 7 08/27/2020  ?  CREATININE 0.83 08/27/2020  ? BILITOT 1.1 08/27/2020  ? ALKPHOS 57 08/27/2020  ? AST 21 08/27/2020  ? ALT 29 08/27/2020  ? PROT 6.3 08/27/2020  ? ALBUMIN 4.6 08/27/2020  ? CALCIUM 9.6 08/27/2020  ? GFR 104.31 08/27/2020  ? ?Lab Results  ?Component Value Date  ? CHOL 184 08/27/2020  ? ?Lab Results  ?Component Value Date  ? HDL 57.00 08/27/2020  ? ?Lab Results  ?Component Value Date  ? LDLCALC 113 (H) 08/27/2020  ? ?Lab Results  ?Component Value Date  ? TRIG 69.0 08/27/2020  ? ?Lab Results  ?Component Value Date  ? CHOLHDL 3 08/27/2020  ? ?Lab Results  ?Component Value Date  ? HGBA1C 5.3 11/09/2018  ? ? ?   ?Assessment & Plan:  ? ?1. Cellulitis of left upper extremity ?Making good progress. Finish out doxycycline. Continue warm compresses 2-3 times a day. Patient aware of signs/symptoms requiring further/urgent evaluation. ? ? ?Please contact office for follow-up if symptoms do not improve or worsen. Seek emergency care if symptoms become severe. ? ? ?Terrilyn Saver, NP ? ?

## 2021-12-29 IMAGING — US US SCROTUM W/ DOPPLER COMPLETE
2 series · 13 of 25 positions shown · non-contrast
Comparison: None.

CLINICAL DATA: Left testicular lump

EXAM:
SCROTAL ULTRASOUND
DOPPLER ULTRASOUND OF THE TESTICLES
TECHNIQUE: Complete ultrasound examination of the testicles, epididymis, and
other scrotal structures was performed. Color and spectral Doppler
ultrasound were also utilized to evaluate blood flow to the
testicles.

[Series 1: us scrotum w/ doppler complete · 12 of 48 slices shown (1 of 2)]
[im 1/48]
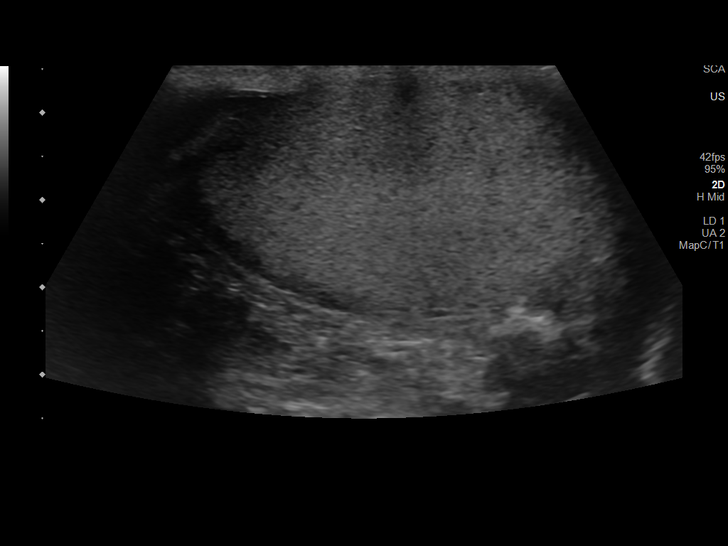
[im 5/48]
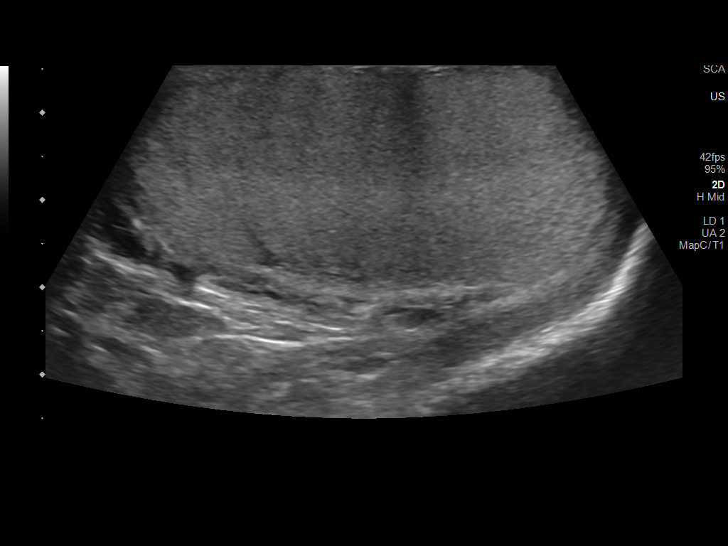
[im 9/48]
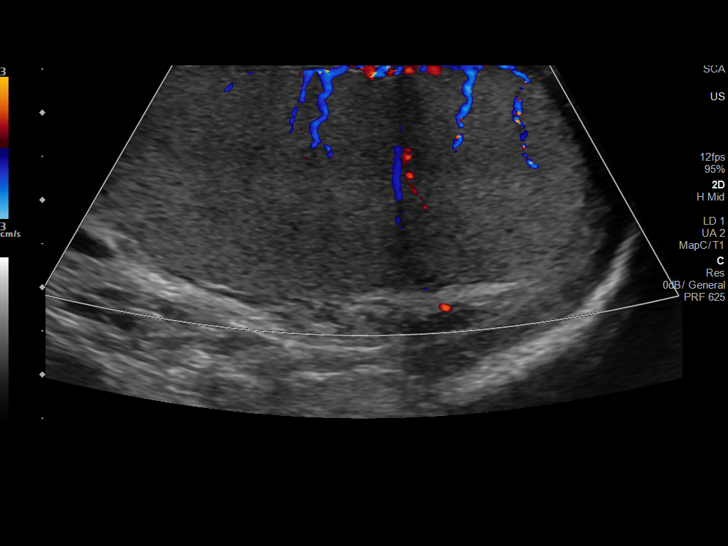
[im 13/48]
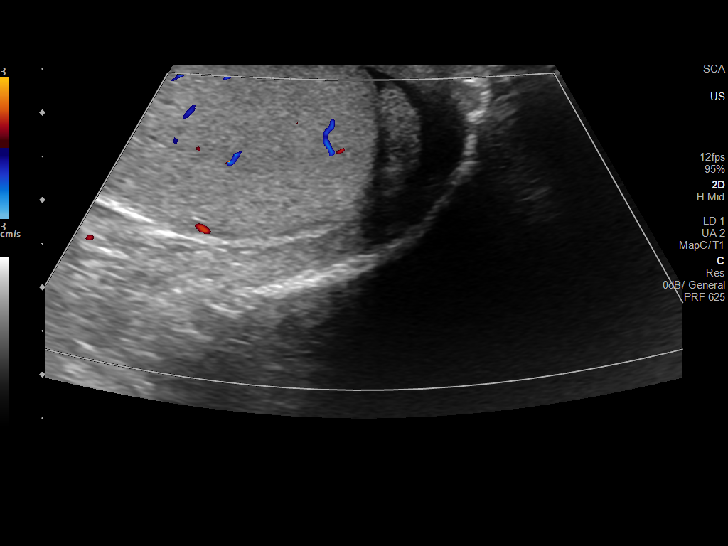
[im 17/48]
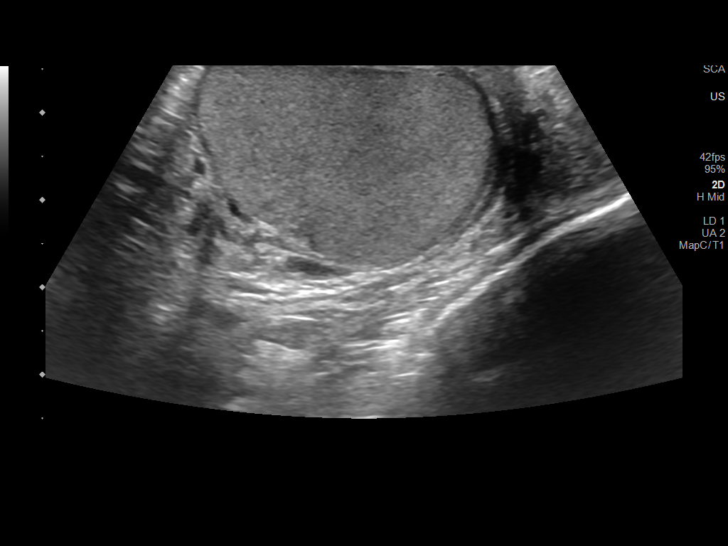
[im 21/48]
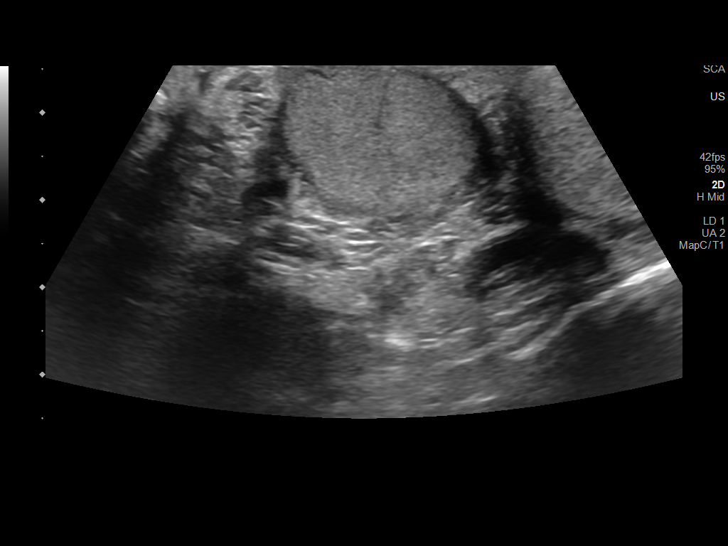
[im 25/48]
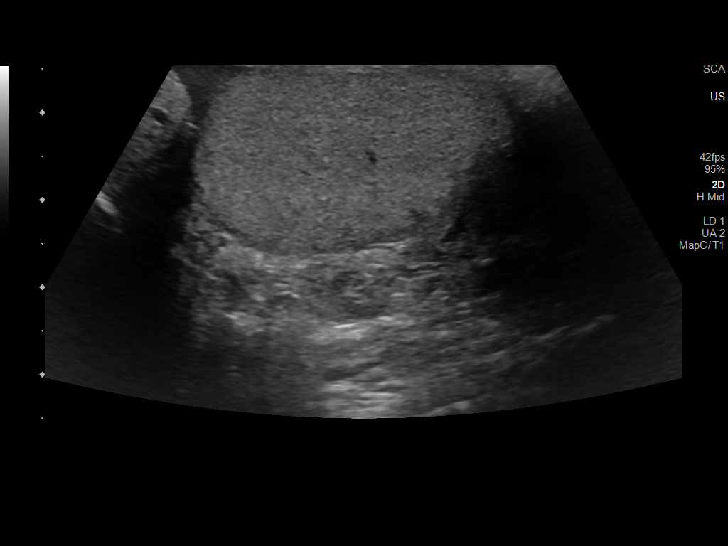
[im 29/48]
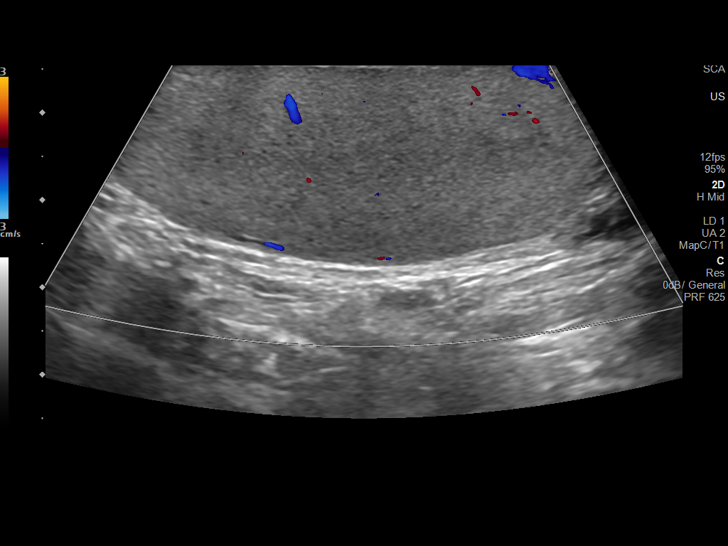
[im 33/48]
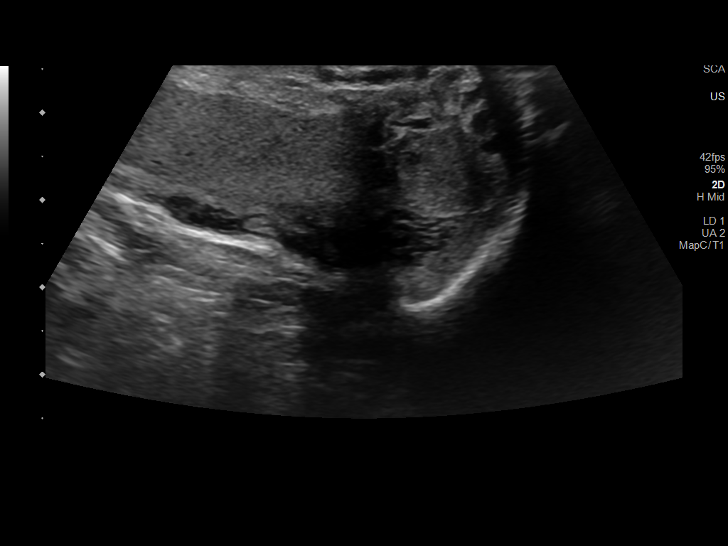
[im 37/48]
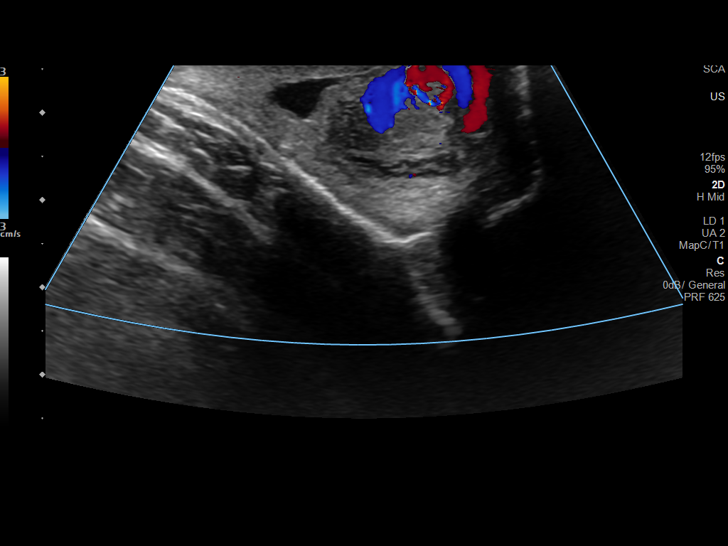
[im 41/48]
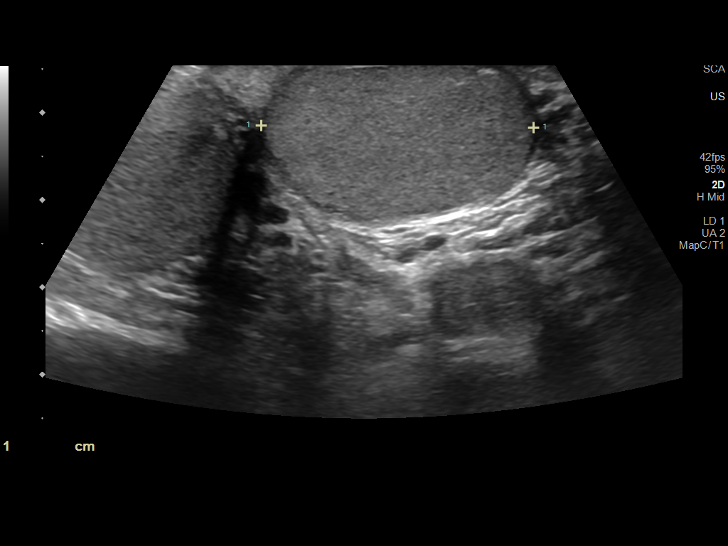
[im 45/48]
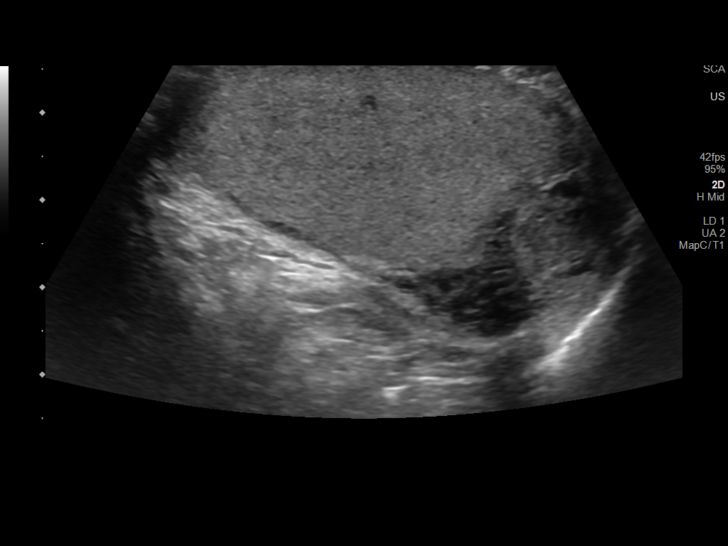

[Series 2: us scrotum w/ doppler complete · 1 of 1 slices shown (2 of 2)]
[im 1/1]
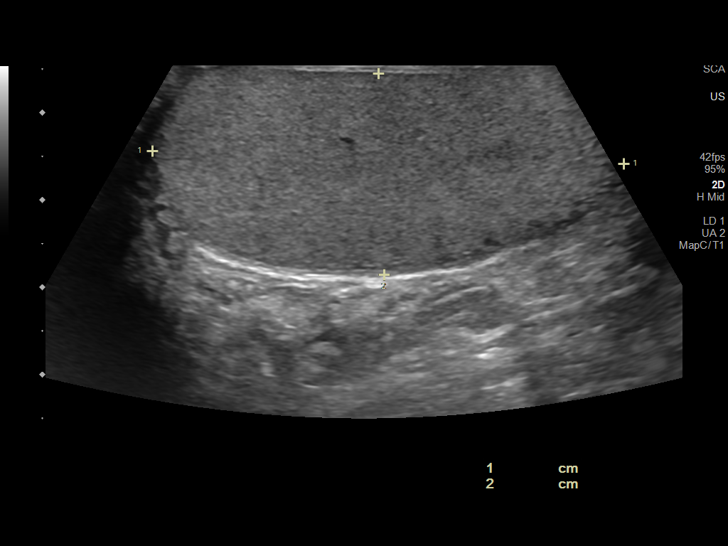

[13 of 25 positions shown; findings below may reference images not displayed]

FINDINGS: Right testicle

Measurements: 5.7 x 2.9 x 3.4 cm. No mass or microlithiasis
visualized.

Left testicle

Measurements: 5.4 x 2.3 x 3.1 cm. No mass or microlithiasis
visualized.

Right epididymis:  Normal in size and appearance.

Left epididymis: Enlarged, edematous appearing left epididymal tail.

Hydrocele:  None visualized.

Varicocele:  None visualized.

Pulsed Doppler interrogation of both testes demonstrates normal low
resistance arterial and venous waveforms bilaterally.
IMPRESSION: 1. Enlarged, edematous appearing left epididymal tail, consistent
with epididymitis.
2. Normal size and sonographic appearance of the bilateral testicles
proper with symmetric arterial and venous Doppler flow.

## 2022-04-14 ENCOUNTER — Encounter: Payer: Self-pay | Admitting: Internal Medicine

## 2022-06-17 ENCOUNTER — Ambulatory Visit (INDEPENDENT_AMBULATORY_CARE_PROVIDER_SITE_OTHER): Payer: 59 | Admitting: Urology

## 2022-06-17 ENCOUNTER — Encounter: Payer: Self-pay | Admitting: Urology

## 2022-06-17 VITALS — BP 137/83 | HR 78 | Ht 70.0 in | Wt 183.0 lb

## 2022-06-17 DIAGNOSIS — N5089 Other specified disorders of the male genital organs: Secondary | ICD-10-CM | POA: Insufficient documentation

## 2022-06-17 LAB — URINALYSIS, ROUTINE W REFLEX MICROSCOPIC
Bilirubin, UA: NEGATIVE
Glucose, UA: NEGATIVE
Ketones, UA: NEGATIVE
Leukocytes,UA: NEGATIVE
Nitrite, UA: NEGATIVE
Protein,UA: NEGATIVE
RBC, UA: NEGATIVE
Specific Gravity, UA: 1.01 (ref 1.005–1.030)
Urobilinogen, Ur: 0.2 mg/dL (ref 0.2–1.0)
pH, UA: 5.5 (ref 5.0–7.5)

## 2022-06-17 MED ORDER — SULFAMETHOXAZOLE-TRIMETHOPRIM 800-160 MG PO TABS: 1.0000 | ORAL_TABLET | Freq: Two times a day (BID) | ORAL | 0 refills | Status: AC

## 2022-06-17 NOTE — Progress Notes (Signed)
Assessment: 1. Epididymal mass; left     Plan: I personally reviewed the patient's chart including provider notes, and imaging results. I reassured the patient that his exam is not concerning for testicular cancer and that the majority of extratesticular masses are benign.  This palpable abnormality may be a residual from his prior episode of epididymitis. I provided him with a prescription for Bactrim DS twice daily x 10 days if he has a increase in symptoms suggestive of epididymitis. Recommended continue self exams. Return to office as needed.  Chief Complaint:  Chief Complaint  Patient presents with   Scrotal mass    History of Present Illness:  Jimmy Hodge is a 47 y.o. male who is seen for evaluation of a scrotal mass.  He noted a small mass in the inferior left scrotum on self-exam about 4 days ago.  He has not had significant tenderness associated with this.  He reports this is similar to a prior episode of similar symptoms in November 2022.  He was treated with antibiotics and the palpable area resolved. Scrotal ultrasound from 11/22 showed an enlarged edematous appearing left epididymal tail consistent with epididymitis and normal testes bilaterally. No history of scrotal trauma.  No scrotal swelling or redness.  He has recently noted some increased frequency and urgency with a "tingling sensation" when he voids.  He has recently had upper respiratory symptoms and has taken several days of Augmentin. 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 2015   bilateral ?   TONSILLECTOMY      Allergies:  No Known Allergies  Family History:  Family History  Problem Relation Age of Onset   Hypertension Father    Leukemia Father        CML?    Colon cancer Neg Hx    Prostate cancer Neg Hx    CAD Neg Hx    Colon polyps Neg Hx    Esophageal cancer Neg Hx    Stomach cancer Neg Hx    Rectal cancer Neg Hx     Social History:  Social History   Tobacco Use   Smoking status: Never   Smokeless tobacco: Never  Vaping Use   Vaping Use: Never used  Substance Use Topics   Alcohol use: Yes    Comment: social   Drug use: No    Comment: qd     Review of symptoms:  Constitutional:  Negative for unexplained weight loss, night sweats, fever, chills ENT:  Negative for nose bleeds, sinus pain, painful swallowing CV:  Negative for chest pain, shortness of breath, exercise intolerance, palpitations, loss of consciousness Resp:  Negative for cough, wheezing, shortness of breath GI:  Negative for nausea, vomiting, diarrhea, bloody stools GU:  Positives noted in HPI; otherwise negative for gross hematuria, dysuria, urinary incontinence Neuro:  Negative for seizures, poor balance, limb weakness, slurred speech Psych:  Negative for lack of energy, depression, anxiety Endocrine:  Negative for polydipsia, polyuria, symptoms of hypoglycemia (dizziness, hunger, sweating) Hematologic:  Negative for anemia, purpura, petechia, prolonged or excessive bleeding, use of anticoagulants  Allergic:  Negative for difficulty breathing or choking as a result of exposure to anything; no shellfish allergy; no allergic  response (rash/itch) to materials, foods  Physical exam: BP 137/83   Pulse 78   Ht 5\' 10"  (1.778 m)   Wt 183 lb (83 kg)   BMI 26.26 kg/m  GENERAL APPEARANCE:  Well appearing, well developed, well nourished, NAD HEENT: Atraumatic, Normocephalic, oropharynx clear. NECK: Supple without lymphadenopathy or thyromegaly. LUNGS: Clear to auscultation bilaterally. HEART: Regular Rate and Rhythm without murmurs, gallops, or rubs. ABDOMEN: Soft, non-tender, No Masses. EXTREMITIES: Moves all extremities well.  Without clubbing, cyanosis, or  edema. NEUROLOGIC:  Alert and oriented x 3, normal gait, CN II-XII grossly intact.  MENTAL STATUS:  Appropriate. BACK:  Non-tender to palpation.  No CVAT SKIN:  Warm, dry and intact.   GU: Penis:  circumcised Meatus: Normal Scrotum: no erythema or edema Testis: normal without masses bilateral Epididymis: palpable nodule at left epididymal tail, non-tender   Results: U/A negative

## 2022-12-03 ENCOUNTER — Encounter: Payer: Self-pay | Admitting: Internal Medicine

## 2022-12-03 ENCOUNTER — Ambulatory Visit (INDEPENDENT_AMBULATORY_CARE_PROVIDER_SITE_OTHER): Payer: 59 | Admitting: Internal Medicine

## 2022-12-03 VITALS — BP 120/82 | HR 66 | Temp 98.2°F | Resp 16 | Ht 70.0 in | Wt 187.2 lb

## 2022-12-03 DIAGNOSIS — Z125 Encounter for screening for malignant neoplasm of prostate: Secondary | ICD-10-CM | POA: Diagnosis not present

## 2022-12-03 DIAGNOSIS — Z0001 Encounter for general adult medical examination with abnormal findings: Secondary | ICD-10-CM | POA: Diagnosis not present

## 2022-12-03 DIAGNOSIS — Z13220 Encounter for screening for lipoid disorders: Secondary | ICD-10-CM | POA: Diagnosis not present

## 2022-12-03 DIAGNOSIS — Z0000 Encounter for general adult medical examination without abnormal findings: Secondary | ICD-10-CM

## 2022-12-03 DIAGNOSIS — K625 Hemorrhage of anus and rectum: Secondary | ICD-10-CM

## 2022-12-03 LAB — COMPREHENSIVE METABOLIC PANEL
ALT: 28 U/L (ref 0–53)
AST: 19 U/L (ref 0–37)
Albumin: 4.4 g/dL (ref 3.5–5.2)
Alkaline Phosphatase: 59 U/L (ref 39–117)
BUN: 12 mg/dL (ref 6–23)
CO2: 28 meq/L (ref 19–32)
Calcium: 9.5 mg/dL (ref 8.4–10.5)
Chloride: 104 meq/L (ref 96–112)
Creatinine, Ser: 0.88 mg/dL (ref 0.40–1.50)
GFR: 100.87 mL/min (ref >60.00)
Glucose, Bld: 92 mg/dL (ref 70–99)
Potassium: 4.2 meq/L (ref 3.5–5.1)
Sodium: 139 meq/L (ref 135–145)
Total Bilirubin: 2.1 mg/dL — ABNORMAL HIGH (ref 0.2–1.2)
Total Protein: 6.4 g/dL (ref 6.0–8.3)

## 2022-12-03 LAB — LIPID PANEL
Cholesterol: 177 mg/dL (ref 0–200)
HDL: 63 mg/dL (ref >39.00)
LDL Cholesterol: 101 mg/dL — ABNORMAL HIGH (ref 0–99)
NonHDL: 114
Total CHOL/HDL Ratio: 3
Triglycerides: 64 mg/dL (ref 0.0–149.0)
VLDL: 12.8 mg/dL (ref 0.0–40.0)

## 2022-12-03 LAB — CBC WITH DIFFERENTIAL/PLATELET
Basophils Absolute: 0 10*3/uL (ref 0.0–0.1)
Basophils Relative: 0.7 % (ref 0.0–3.0)
Eosinophils Absolute: 0.1 10*3/uL (ref 0.0–0.7)
Eosinophils Relative: 2.5 % (ref 0.0–5.0)
HCT: 46 % (ref 39.0–52.0)
Hemoglobin: 15.8 g/dL (ref 13.0–17.0)
Lymphocytes Relative: 28.9 % (ref 12.0–46.0)
Lymphs Abs: 1.4 10*3/uL (ref 0.7–4.0)
MCHC: 34.2 g/dL (ref 30.0–36.0)
MCV: 90.5 fl (ref 78.0–100.0)
Monocytes Absolute: 0.4 10*3/uL (ref 0.1–1.0)
Monocytes Relative: 7.4 % (ref 3.0–12.0)
Neutro Abs: 3 10*3/uL (ref 1.4–7.7)
Neutrophils Relative %: 60.5 % (ref 43.0–77.0)
Platelets: 291 10*3/uL (ref 150.0–400.0)
RBC: 5.09 Mil/uL (ref 4.22–5.81)
RDW: 13.6 % (ref 11.5–15.5)
WBC: 5 10*3/uL (ref 4.0–10.5)

## 2022-12-03 LAB — PSA: PSA: 0.84 ng/mL (ref 0.10–4.00)

## 2022-12-03 LAB — B12 AND FOLATE PANEL
Folate: 7.5 ng/mL (ref >5.9)
Vitamin B-12: 230 pg/mL (ref 211–911)

## 2022-12-03 LAB — VITAMIN D 25 HYDROXY (VIT D DEFICIENCY, FRACTURES): VITD: 43.23 ng/mL (ref 30.00–100.00)

## 2022-12-03 NOTE — Patient Instructions (Addendum)
Vaccines I recommend: COVID booster Flu shot this fall    GO TO THE LAB : Get the blood work     GO TO THE FRONT DESK, PLEASE SCHEDULE YOUR APPOINTMENTS Come back for    a physical exam in 1 year

## 2022-12-03 NOTE — Assessment & Plan Note (Signed)
Here for CPX - Td: 2015 -Vaccines are recommended: Flu shot, COVID booster -CCS: Had a colonoscopy 2022, + polyps, next per GI. -Diet and exercise: Reports he is doing great lately -Labs: Request to check vitamins.  Will get a CMP FLP CBC .B12. folic acid. vitamin D. PSA

## 2022-12-03 NOTE — Progress Notes (Signed)
Subjective:    Patient ID: Jimmy Hodge, male    DOB: [birth year], 47 y.o.   MRN: [id number]  DOS:  12/03/2022 Type of visit - description: Here for CPX.  Here for CPX.  Feeling great. 2 months ago had a episode of diarrhea with frequent bowel movements and frequent wiping. At the time, he saw some red, fresh blood in the toilet paper and  on the stools. No further events. No fever or chills.  No weight loss.  No chronic GI symptoms.   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 2015   bilateral ?   TONSILLECTOMY     Social History   Socioeconomic History   Marital status: Married    Spouse name: Not on file   Number of children: 2   Years of education: Not on file   Highest education level: Not on file  Occupational History   Occupation: [employer]  Tobacco Use   Smoking status: Never   Smokeless tobacco: Never  Vaping Use   Vaping status: Never Used  Substance and Sexual Activity   Alcohol use: Yes    Comment: social   Drug use: No    Comment: qd    Sexual activity: Not on file  Other Topics Concern   Not on file  Social History Narrative   Boys 2008, 2007   Social Determinants of Health   Financial Resource Strain: Not on file  Food Insecurity: Not on file  Transportation Needs: Not on file  Physical Activity: Not on file  Stress: Not on file  Social Connections: Not on file  Intimate Partner Violence: Not on file    No current outpatient medications     Objective:   Physical Exam BP 120/82   Pulse 66   Temp 98.2 F (36.8 C) (Oral)   Resp 16   Ht 5\' 10"  (1.778 m)   Wt 187 lb 4 oz (84.9 kg)   SpO2 98%   BMI 26.87 kg/m  General: Well developed, NAD, BMI noted Neck: No  thyromegaly  HEENT:   Normocephalic . Face symmetric, atraumatic Lungs:  CTA B Normal respiratory effort, no intercostal retractions, no accessory muscle use. Heart: RRR,  no murmur.  Abdomen:  Not distended, soft, non-tender. No rebound or rigidity.   Lower extremities: no pretibial edema bilaterally DRE: External examination normal.  Normal prostate.  No stools Anoscopy, procedure note: With the patient permission I introduced a self lighted, well-lubricated anoscope, few small internal hemorrhoids noted otherwise normal. Skin: Exposed areas without rash. Not pale. Not jaundice Neurologic:  alert & oriented X3.  Speech normal, gait appropriate for age and unassisted Strength symmetric and appropriate for age.  Psych: Cognition and judgment appear intact.  Cooperative with normal attention span and concentration.  Behavior appropriate. No anxious or depressed appearing.     Assessment    ASSESSMENT  (new patient 2020, referred by his wife) Skin cancer , scalp p Moh's    PLAN Here for CPX - Td: 2015 -Vaccines are recommended: Flu shot, COVID booster -CCS: Had a colonoscopy 2022, + polyps, next per GI. -Diet and exercise: Reports he is doing great lately -  Labs: Request to check vitamins.  Will get a CMP FLP CBC .B12. folic acid. vitamin D. PSA Red blood per rectum: A single event, in the context of diarrhea and frequent wiping.  Colonoscopy 2 years ago benign. Anoscopy today, + internal hemorrhoids. Most likely this was a benign event.  Recommend observation.  If this happens again he will let me know. RTC in 1 year

## 2022-12-03 NOTE — Assessment & Plan Note (Signed)
Here for CPX Red blood per rectum: A single event, in the context of diarrhea and frequent wiping.  Colonoscopy 2 years ago benign. Anoscopy today, + internal hemorrhoids. Most likely this was a benign event.  Recommend observation.  If this happens again he will let me know. RTC in 1 year

## 2022-12-04 ENCOUNTER — Encounter: Payer: Self-pay | Admitting: Internal Medicine

## 2023-09-08 ENCOUNTER — Ambulatory Visit (INDEPENDENT_AMBULATORY_CARE_PROVIDER_SITE_OTHER): Admitting: Internal Medicine

## 2023-09-08 ENCOUNTER — Ambulatory Visit

## 2023-09-08 ENCOUNTER — Encounter (HOSPITAL_BASED_OUTPATIENT_CLINIC_OR_DEPARTMENT_OTHER): Payer: Self-pay

## 2023-09-08 ENCOUNTER — Encounter: Payer: Self-pay | Admitting: Internal Medicine

## 2023-09-08 ENCOUNTER — Ambulatory Visit (HOSPITAL_BASED_OUTPATIENT_CLINIC_OR_DEPARTMENT_OTHER): Admission: RE | Admit: 2023-09-08 | Source: Ambulatory Visit

## 2023-09-08 VITALS — BP 116/86 | HR 51 | Temp 97.8°F | Resp 16 | Ht 70.0 in | Wt 187.4 lb

## 2023-09-08 DIAGNOSIS — R1031 Right lower quadrant pain: Secondary | ICD-10-CM | POA: Diagnosis not present

## 2023-09-08 DIAGNOSIS — R1032 Left lower quadrant pain: Secondary | ICD-10-CM

## 2023-09-08 LAB — URINALYSIS, ROUTINE W REFLEX MICROSCOPIC
Bilirubin Urine: NEGATIVE
Hgb urine dipstick: NEGATIVE
Ketones, ur: NEGATIVE
Leukocytes,Ua: NEGATIVE
Nitrite: NEGATIVE
RBC / HPF: NONE SEEN (ref 0–?)
Specific Gravity, Urine: <=1.005 — AB (ref 1.000–1.030)
Total Protein, Urine: NEGATIVE
Urine Glucose: NEGATIVE
Urobilinogen, UA: 0.2 (ref 0.0–1.0)
WBC, UA: NONE SEEN (ref 0–?)
pH: 6 (ref 5.0–8.0)

## 2023-09-08 NOTE — Assessment & Plan Note (Signed)
 Discomfort, left scrotum/groin: Symptoms are vague, ill-defined, exam is benign except for a slightly enlarged left epididymis which is not necessarily new, see office visit from 01/2021, had epididymitis then. Etiology not clear, to be sure we will get a UA, urine culture, scrotal ultrasound. He is somewhat concerned about this being something serious like cancer. If workup negative and symptoms continue, consider urology referral. RTC as scheduled for September CPX

## 2023-09-08 NOTE — Progress Notes (Signed)
   Subjective:    Patient ID: Jimmy Hodge, male    DOB: April 07, 1975, 48 y.o.   MRN: [id number]  DOS:  09/08/2023 Type of visit - description: Acute  Symptoms started about 2 to 3 weeks ago. Reports some discomfort at the left side, between the scrotum and the leg. Is not really pain is a discomfort. Has a history of hernia repair remotely, side?  Has not feel any bulging area. Subsequently he said that sometimes have a mild discomfort at the suprapubic area bilaterally as well.  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 2015   bilateral ?   TONSILLECTOMY      No current outpatient medications     Objective:   Physical Exam BP 116/86   Pulse (!) 51   Temp 97.8 F (36.6 C) (Oral)   Resp 16   Ht 5' 10 (1.778 m)   Wt 187 lb 6 oz (85 kg)   SpO2 95%   BMI 26.89 kg/m  General:   Well developed, NAD, BMI noted.  HEENT:  Normocephalic . Face symmetric, atraumatic Abdomen:  Not distended, soft, non-tender. No rebound or rigidity.   GU: DRE: Normal sphincter tone, no stool, prostate normal Penis normal. Testicles normal Left epididymis more prominent but not tender. Groins: No hernia that I can tell, no lymphadenopathies. Skin: Not pale. Not jaundice Lower extremities: no pretibial edema bilaterally  Neurologic:  alert & oriented X3.  Speech normal, gait appropriate for age and unassisted Psych--  Cognition and judgment appear intact.  Cooperative with normal attention span and concentration.  Behavior appropriate. No anxious or depressed appearing.     Assessment    ASSESSMENT  (new patient 2020, referred by his wife) Skin  cancer , scalp p Moh's    PLAN: Discomfort, left scrotum/groin: Symptoms are vague, ill-defined, exam is benign except for a slightly enlarged left epididymis which is not necessarily new, see office visit from 01/2021, had epididymitis then. Etiology not clear, to be sure we will get a UA, urine culture, scrotal ultrasound. He is somewhat concerned about this being something serious like cancer. If workup negative and symptoms continue, consider urology referral. RTC as scheduled for September CPX

## 2023-09-08 NOTE — Patient Instructions (Signed)
 Provide a urine sample  Stop by the first floor and arrange for a ultrasound  If you continue with symptoms in the next few weeks, please reach out.  Will refer you to a urologist.  See you in September.

## 2023-09-09 ENCOUNTER — Other Ambulatory Visit

## 2023-09-09 DIAGNOSIS — R1031 Right lower quadrant pain: Secondary | ICD-10-CM

## 2023-09-10 LAB — URINE CULTURE
MICRO NUMBER:: 16595868
Result:: NO GROWTH
SPECIMEN QUALITY:: ADEQUATE

## 2023-09-11 ENCOUNTER — Ambulatory Visit
Admission: RE | Admit: 2023-09-11 | Discharge: 2023-09-11 | Disposition: A | Discharge status: Home / Self Care | Source: Ambulatory Visit | Attending: Internal Medicine | Admitting: Internal Medicine

## 2023-09-13 ENCOUNTER — Ambulatory Visit: Payer: Self-pay | Admitting: Internal Medicine

## 2023-12-07 ENCOUNTER — Encounter: Payer: 59 | Admitting: Internal Medicine

## 2024-01-01 ENCOUNTER — Ambulatory Visit: Admitting: Internal Medicine

## 2024-01-01 ENCOUNTER — Encounter: Payer: Self-pay | Admitting: Internal Medicine

## 2024-01-01 VITALS — BP 120/80 | HR 60 | Temp 98.0°F | Resp 16 | Ht 70.0 in | Wt 188.2 lb

## 2024-01-01 DIAGNOSIS — Z0000 Encounter for general adult medical examination without abnormal findings: Secondary | ICD-10-CM

## 2024-01-01 NOTE — Progress Notes (Signed)
   Subjective:    Patient ID: Jimmy Hodge, male    DOB: 06-09-1975, 48 y.o.   MRN: [id number]  DOS:  01/01/2024 CPX  Discussed the use of AI scribe software for clinical note transcription with the patient, who gave verbal consent to proceed.  History of Present Illness  General health status - No current health concerns - Feels well overall - No medications  Cardiopulmonary symptoms - No chest pain - No respiratory difficulties  Gastrointestinal symptoms - No nausea or vomiting - No blood in stools  Genitourinary symptoms - No urinary problems  Lifestyle and preventive health - Exercises regularly with focus on weight lifting - Maintains a healthy diet including fruits, chicken, and beef - Avoids sugar and processed foods - Consumes beer occasionally - Does not smoke   Review of Systems  A 14 point review of systems is negative     Past Medical History:  Diagnosis Date   Allergic rhinitis    Cancer ([facility])    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 2015   bilateral ?   TONSILLECTOMY      No current outpatient medications     Objective:   Physical Exam BP 120/80   Pulse 60   Temp 98 F (36.7 C) (Oral)   Resp 16   Ht 5' 10 (1.778 m)   Wt 188 lb 4 oz (85.4 kg)   SpO2 98%   BMI 27.01 kg/m  General: Well developed, NAD, BMI noted Neck: No  thyromegaly  HEENT:  Normocephalic . Face symmetric, atraumatic Lungs:  CTA B Normal respiratory effort, no intercostal retractions, no accessory muscle use. Heart: RRR,  no murmur.  Abdomen:  Not distended, soft, non-tender. No rebound or rigidity.   Lower extremities: no pretibial edema bilaterally  Skin: Exposed areas without rash. Not pale. Not jaundice Neurologic:  alert & oriented X3.  Speech normal, gait appropriate for age and unassisted Strength symmetric and appropriate for age.  Psych: Cognition and  judgment appear intact.  Cooperative with normal attention span and concentration.  Behavior appropriate. No anxious or depressed appearing.     Assessment    ASSESSMENT  (new patient 2020, referred by his wife) Skin cancer , scalp p Moh's   Assessment & Plan Here for CPX - Td: 2015 -Vaccines I recommended: Tdap, Flu shot, COVID booster, shingles, d/w pt pros>cons -CCS: Had a colonoscopy 2022, + polyps, next per GI. - Prostate cancer screening: No FH, no symptoms, check PSA -Diet and exercise: Exercising regularly, more lifting than cardio, encourage increase cardio.  Diet is healthy.  Also recommend to consider a multivitamin, last B12 was in the low side of normal. -Labs: See orders  Skin cancer: Sees dermatology regularly. RTC 1 year

## 2024-01-01 NOTE — Patient Instructions (Signed)
 GO TO THE LAB :  Get the blood work    Then, go to the front desk for the checkout Please make an appointment for a physical exam for 12-2024   Vaccines are recommended Tetanus shot (Tdap) Flu shot COVID booster Shingles shot  Also consider taking a multivitamin daily  Please read more detailed instructions below        YOUR PLAN: ADULT WELLNESS VISIT: Annual wellness visit completed with normal blood pressure and no current symptoms. Previous blood work was normal except for low normal B12 levels. -Order blood work including PSA and thyroid function tests. -Recommend taking a multivitamin for men to address low normal B12 levels.

## 2024-01-02 ENCOUNTER — Encounter: Payer: Self-pay | Admitting: Internal Medicine

## 2024-01-02 ENCOUNTER — Ambulatory Visit: Payer: Self-pay | Admitting: Internal Medicine

## 2024-01-02 LAB — CBC WITH DIFFERENTIAL/PLATELET
Absolute Lymphocytes: 2022 {cells}/uL (ref 850–3900)
Absolute Monocytes: 462 {cells}/uL (ref 200–950)
Basophils Absolute: 30 {cells}/uL (ref 0–200)
Basophils Relative: 0.5 %
Eosinophils Absolute: 72 {cells}/uL (ref 15–500)
Eosinophils Relative: 1.2 %
HCT: 47.4 % (ref 38.5–50.0)
Hemoglobin: 16.4 g/dL (ref 13.2–17.1)
MCH: 31.4 pg (ref 27.0–33.0)
MCHC: 34.6 g/dL (ref 32.0–36.0)
MCV: 90.6 fL (ref 80.0–100.0)
MPV: 9.3 fL (ref 7.5–12.5)
Monocytes Relative: 7.7 %
Neutro Abs: 3414 {cells}/uL (ref 1500–7800)
Neutrophils Relative %: 56.9 %
Platelets: 293 Thousand/uL (ref 140–400)
RBC: 5.23 Million/uL (ref 4.20–5.80)
RDW: 12.6 % (ref 11.0–15.0)
Total Lymphocyte: 33.7 %
WBC: 6 Thousand/uL (ref 3.8–10.8)

## 2024-01-02 LAB — COMPREHENSIVE METABOLIC PANEL WITH GFR
AG Ratio: 2.5 (calc) (ref 1.0–2.5)
ALT: 26 U/L (ref 9–46)
AST: 18 U/L (ref 10–35)
Albumin: 4.8 g/dL (ref 3.6–5.1)
Alkaline phosphatase (APISO): 55 U/L (ref 35–144)
BUN: 13 mg/dL (ref 7–25)
CO2: 28 mmol/L (ref 20–32)
Calcium: 9.7 mg/dL (ref 8.6–10.3)
Chloride: 104 mmol/L (ref 98–110)
Creat: 0.96 mg/dL (ref 0.70–1.30)
Globulin: 1.9 g/dL (ref 1.9–3.7)
Glucose, Bld: 90 mg/dL (ref 65–99)
Potassium: 4.3 mmol/L (ref 3.5–5.3)
Sodium: 140 mmol/L (ref 135–146)
Total Bilirubin: 1.3 mg/dL — ABNORMAL HIGH (ref 0.2–1.2)
Total Protein: 6.7 g/dL (ref 6.1–8.1)
eGFR: 96 mL/min/1.73m2 (ref >=60)

## 2024-01-02 LAB — LIPID PANEL
Cholesterol: 197 mg/dL (ref <200)
HDL: 66 mg/dL (ref >=40)
LDL Cholesterol (Calc): 117 mg/dL — ABNORMAL HIGH
Non-HDL Cholesterol (Calc): 131 mg/dL — ABNORMAL HIGH (ref <130)
Total CHOL/HDL Ratio: 3 (calc) (ref <5.0)
Triglycerides: 45 mg/dL (ref <150)

## 2024-01-02 LAB — PSA: PSA: 0.78 ng/mL (ref <=4.00)

## 2024-01-02 LAB — TSH: TSH: 0.88 m[IU]/L (ref 0.40–4.50)

## 2024-01-02 NOTE — Assessment & Plan Note (Signed)
 Here for CPX - Td: 2015 -Vaccines I recommended: Tdap, Flu shot, COVID booster, shingles, d/w pt pros>cons -CCS: Had a colonoscopy 2022, + polyps, next per GI. - Prostate cancer screening: No FH, no symptoms, check PSA -Diet and exercise: Exercising regularly, more lifting than cardio, encourage increase cardio.  Diet is healthy.  Also recommend to consider a multivitamin, last B12 was in the low side of normal. -Labs: See orders

## 2024-01-02 NOTE — Assessment & Plan Note (Signed)
 Here for CPX   Skin cancer: Sees dermatology regularly. RTC 1 year

## 2025-01-02 ENCOUNTER — Encounter: Admitting: Internal Medicine
